# Patient Record
Sex: Female | Born: 1955 | Race: Black or African American | Hispanic: No | Smoking: Current some day smoker
Health system: Southern US, Community
[De-identification: ages and names within clinical notes are randomized; demographics above are authoritative.]

## PROBLEM LIST (undated history)

## (undated) DIAGNOSIS — I10 Essential (primary) hypertension: Secondary | ICD-10-CM

## (undated) DIAGNOSIS — G4733 Obstructive sleep apnea (adult) (pediatric): Secondary | ICD-10-CM

## (undated) DIAGNOSIS — Z9989 Dependence on other enabling machines and devices: Secondary | ICD-10-CM

## (undated) DIAGNOSIS — M51369 Other intervertebral disc degeneration, lumbar region without mention of lumbar back pain or lower extremity pain: Secondary | ICD-10-CM

## (undated) DIAGNOSIS — Z8742 Personal history of other diseases of the female genital tract: Secondary | ICD-10-CM

## (undated) DIAGNOSIS — R29898 Other symptoms and signs involving the musculoskeletal system: Secondary | ICD-10-CM

## (undated) DIAGNOSIS — M1990 Unspecified osteoarthritis, unspecified site: Secondary | ICD-10-CM

## (undated) DIAGNOSIS — E782 Mixed hyperlipidemia: Secondary | ICD-10-CM

## (undated) DIAGNOSIS — J45909 Unspecified asthma, uncomplicated: Secondary | ICD-10-CM

## (undated) DIAGNOSIS — Z973 Presence of spectacles and contact lenses: Secondary | ICD-10-CM

## (undated) DIAGNOSIS — M8580 Other specified disorders of bone density and structure, unspecified site: Secondary | ICD-10-CM

## (undated) DIAGNOSIS — H04123 Dry eye syndrome of bilateral lacrimal glands: Secondary | ICD-10-CM

## (undated) DIAGNOSIS — M5136 Other intervertebral disc degeneration, lumbar region: Secondary | ICD-10-CM

## (undated) DIAGNOSIS — M25551 Pain in right hip: Secondary | ICD-10-CM

## (undated) DIAGNOSIS — E119 Type 2 diabetes mellitus without complications: Secondary | ICD-10-CM

## (undated) DIAGNOSIS — M169 Osteoarthritis of hip, unspecified: Secondary | ICD-10-CM

## (undated) DIAGNOSIS — M25552 Pain in left hip: Secondary | ICD-10-CM

## (undated) HISTORY — PX: OTHER SURGICAL HISTORY: SHX169

## (undated) HISTORY — DX: Essential (primary) hypertension: I10

## (undated) HISTORY — PX: KNEE ARTHROSCOPY: SUR90

## (undated) HISTORY — PX: CARPAL TUNNEL RELEASE: SHX101

## (undated) HISTORY — PX: MULTIPLE TOOTH EXTRACTIONS: SHX2053

## (undated) HISTORY — DX: Dry eye syndrome of bilateral lacrimal glands: H04.123

## (undated) HISTORY — PX: COLONOSCOPY: SHX174

## (undated) HISTORY — PX: KNEE ARTHROSCOPY W/ LATERAL RELEASE: SHX1873

---

## 1979-01-02 HISTORY — PX: UMBILICAL HERNIA REPAIR: SHX196

## 1981-01-01 HISTORY — PX: APPENDECTOMY: SHX54

## 1997-08-03 ENCOUNTER — Ambulatory Visit (HOSPITAL_COMMUNITY): Admission: RE | Admit: 1997-08-03 | Discharge: 1997-08-03 | Payer: Self-pay | Admitting: Obstetrics and Gynecology

## 1998-10-20 ENCOUNTER — Ambulatory Visit (HOSPITAL_BASED_OUTPATIENT_CLINIC_OR_DEPARTMENT_OTHER): Admission: RE | Admit: 1998-10-20 | Discharge: 1998-10-20 | Payer: Self-pay | Admitting: Orthopedic Surgery

## 1999-01-26 ENCOUNTER — Ambulatory Visit (HOSPITAL_BASED_OUTPATIENT_CLINIC_OR_DEPARTMENT_OTHER): Admission: RE | Admit: 1999-01-26 | Discharge: 1999-01-26 | Payer: Self-pay | Admitting: Orthopedic Surgery

## 1999-09-21 ENCOUNTER — Encounter: Payer: Self-pay | Admitting: Family Medicine

## 1999-09-21 ENCOUNTER — Ambulatory Visit (HOSPITAL_COMMUNITY): Admission: RE | Admit: 1999-09-21 | Discharge: 1999-09-21 | Payer: Self-pay | Admitting: Family Medicine

## 2000-10-16 ENCOUNTER — Ambulatory Visit (HOSPITAL_COMMUNITY): Admission: RE | Admit: 2000-10-16 | Discharge: 2000-10-16 | Payer: Self-pay | Admitting: Family Medicine

## 2002-11-13 ENCOUNTER — Emergency Department (HOSPITAL_COMMUNITY): Admission: EM | Admit: 2002-11-13 | Discharge: 2002-11-13 | Payer: Self-pay | Admitting: Emergency Medicine

## 2002-11-20 ENCOUNTER — Emergency Department (HOSPITAL_COMMUNITY): Admission: EM | Admit: 2002-11-20 | Discharge: 2002-11-20 | Payer: Self-pay | Admitting: Emergency Medicine

## 2003-06-22 ENCOUNTER — Ambulatory Visit (HOSPITAL_COMMUNITY): Admission: RE | Admit: 2003-06-22 | Discharge: 2003-06-22 | Payer: Self-pay | Admitting: Family Medicine

## 2003-11-01 ENCOUNTER — Ambulatory Visit: Payer: Self-pay | Admitting: Obstetrics and Gynecology

## 2003-11-01 ENCOUNTER — Inpatient Hospital Stay (HOSPITAL_COMMUNITY): Admission: AD | Admit: 2003-11-01 | Discharge: 2003-11-05 | Payer: Self-pay | Admitting: Obstetrics and Gynecology

## 2003-11-15 ENCOUNTER — Ambulatory Visit (HOSPITAL_BASED_OUTPATIENT_CLINIC_OR_DEPARTMENT_OTHER): Admission: RE | Admit: 2003-11-15 | Discharge: 2003-11-15 | Payer: Self-pay | Admitting: *Deleted

## 2004-07-06 ENCOUNTER — Ambulatory Visit (HOSPITAL_COMMUNITY): Admission: RE | Admit: 2004-07-06 | Discharge: 2004-07-06 | Payer: Self-pay | Admitting: Family Medicine

## 2004-08-18 ENCOUNTER — Other Ambulatory Visit: Admission: RE | Admit: 2004-08-18 | Discharge: 2004-08-18 | Payer: Self-pay | Admitting: Internal Medicine

## 2004-12-20 ENCOUNTER — Ambulatory Visit (HOSPITAL_COMMUNITY): Admission: RE | Admit: 2004-12-20 | Discharge: 2004-12-20 | Payer: Self-pay | Admitting: Gastroenterology

## 2004-12-20 ENCOUNTER — Encounter (INDEPENDENT_AMBULATORY_CARE_PROVIDER_SITE_OTHER): Payer: Self-pay | Admitting: *Deleted

## 2005-01-01 DIAGNOSIS — Z8742 Personal history of other diseases of the female genital tract: Secondary | ICD-10-CM

## 2005-01-01 HISTORY — DX: Personal history of other diseases of the female genital tract: Z87.42

## 2005-07-27 ENCOUNTER — Ambulatory Visit (HOSPITAL_COMMUNITY): Admission: RE | Admit: 2005-07-27 | Discharge: 2005-07-27 | Payer: Self-pay | Admitting: Family Medicine

## 2005-10-15 ENCOUNTER — Inpatient Hospital Stay (HOSPITAL_COMMUNITY): Admission: EM | Admit: 2005-10-15 | Discharge: 2005-10-21 | Payer: Self-pay | Admitting: Obstetrics and Gynecology

## 2005-10-15 ENCOUNTER — Encounter: Payer: Self-pay | Admitting: Emergency Medicine

## 2006-03-12 ENCOUNTER — Emergency Department (HOSPITAL_COMMUNITY): Admission: EM | Admit: 2006-03-12 | Discharge: 2006-03-12 | Payer: Self-pay | Admitting: Emergency Medicine

## 2006-06-12 ENCOUNTER — Ambulatory Visit (HOSPITAL_BASED_OUTPATIENT_CLINIC_OR_DEPARTMENT_OTHER): Admission: RE | Admit: 2006-06-12 | Discharge: 2006-06-12 | Payer: Self-pay | Admitting: Orthopedic Surgery

## 2006-07-10 ENCOUNTER — Encounter: Admission: RE | Admit: 2006-07-10 | Discharge: 2006-08-22 | Payer: Self-pay | Admitting: Orthopedic Surgery

## 2006-08-01 ENCOUNTER — Ambulatory Visit (HOSPITAL_COMMUNITY): Admission: RE | Admit: 2006-08-01 | Discharge: 2006-08-01 | Payer: Self-pay | Admitting: Family Medicine

## 2006-12-20 ENCOUNTER — Other Ambulatory Visit: Admission: RE | Admit: 2006-12-20 | Discharge: 2006-12-20 | Payer: Self-pay | Admitting: Family Medicine

## 2007-08-05 ENCOUNTER — Ambulatory Visit (HOSPITAL_COMMUNITY): Admission: RE | Admit: 2007-08-05 | Discharge: 2007-08-05 | Payer: Self-pay | Admitting: Family Medicine

## 2007-08-14 ENCOUNTER — Ambulatory Visit (HOSPITAL_BASED_OUTPATIENT_CLINIC_OR_DEPARTMENT_OTHER): Admission: RE | Admit: 2007-08-14 | Discharge: 2007-08-14 | Payer: Self-pay | Admitting: Orthopedic Surgery

## 2008-02-25 ENCOUNTER — Other Ambulatory Visit: Admission: RE | Admit: 2008-02-25 | Discharge: 2008-02-25 | Payer: Self-pay | Admitting: Family Medicine

## 2008-03-02 ENCOUNTER — Ambulatory Visit (HOSPITAL_COMMUNITY): Admission: RE | Admit: 2008-03-02 | Discharge: 2008-03-02 | Payer: Self-pay | Admitting: Family Medicine

## 2008-08-06 ENCOUNTER — Ambulatory Visit (HOSPITAL_COMMUNITY): Admission: RE | Admit: 2008-08-06 | Discharge: 2008-08-06 | Payer: Self-pay | Admitting: Family Medicine

## 2008-10-25 ENCOUNTER — Ambulatory Visit: Payer: Self-pay | Admitting: Obstetrics and Gynecology

## 2008-10-25 ENCOUNTER — Inpatient Hospital Stay (HOSPITAL_COMMUNITY): Admission: AD | Admit: 2008-10-25 | Discharge: 2008-10-29 | Payer: Self-pay | Admitting: Obstetrics & Gynecology

## 2008-11-11 ENCOUNTER — Ambulatory Visit: Payer: Self-pay | Admitting: Obstetrics and Gynecology

## 2008-12-01 ENCOUNTER — Ambulatory Visit: Payer: Self-pay | Admitting: Obstetrics & Gynecology

## 2008-12-01 LAB — CONVERTED CEMR LAB
Clue Cells Wet Prep HPF POC: NONE SEEN
Trich, Wet Prep: NONE SEEN
Yeast Wet Prep HPF POC: NONE SEEN

## 2008-12-10 ENCOUNTER — Other Ambulatory Visit: Admission: RE | Admit: 2008-12-10 | Discharge: 2008-12-10 | Payer: Self-pay | Admitting: Obstetrics and Gynecology

## 2008-12-10 ENCOUNTER — Ambulatory Visit: Payer: Self-pay | Admitting: Obstetrics & Gynecology

## 2009-04-22 ENCOUNTER — Ambulatory Visit (HOSPITAL_COMMUNITY): Admission: RE | Admit: 2009-04-22 | Discharge: 2009-04-22 | Payer: Self-pay | Admitting: Obstetrics and Gynecology

## 2009-08-30 ENCOUNTER — Ambulatory Visit (HOSPITAL_COMMUNITY): Admission: RE | Admit: 2009-08-30 | Discharge: 2009-08-30 | Payer: Self-pay | Admitting: Family Medicine

## 2009-12-07 ENCOUNTER — Encounter
Admission: RE | Admit: 2009-12-07 | Discharge: 2009-12-29 | Payer: Self-pay | Source: Home / Self Care | Attending: Physical Medicine and Rehabilitation | Admitting: Physical Medicine and Rehabilitation

## 2010-03-18 ENCOUNTER — Emergency Department (HOSPITAL_COMMUNITY): Payer: Medicaid Other

## 2010-03-18 ENCOUNTER — Observation Stay (HOSPITAL_COMMUNITY)
Admission: EM | Admit: 2010-03-18 | Discharge: 2010-03-19 | Disposition: A | Discharge status: Home / Self Care | Payer: Medicaid Other | Attending: Emergency Medicine | Admitting: Emergency Medicine

## 2010-03-18 DIAGNOSIS — M5412 Radiculopathy, cervical region: Principal | ICD-10-CM | POA: Insufficient documentation

## 2010-03-18 DIAGNOSIS — H539 Unspecified visual disturbance: Secondary | ICD-10-CM | POA: Insufficient documentation

## 2010-03-18 DIAGNOSIS — M25559 Pain in unspecified hip: Secondary | ICD-10-CM | POA: Insufficient documentation

## 2010-03-18 DIAGNOSIS — G459 Transient cerebral ischemic attack, unspecified: Secondary | ICD-10-CM | POA: Insufficient documentation

## 2010-03-18 LAB — CK TOTAL AND CKMB (NOT AT ARMC)
Relative Index: 1.8 (ref 0.0–2.5)
Total CK: 188 U/L — ABNORMAL HIGH (ref 7–177)

## 2010-03-18 LAB — COMPREHENSIVE METABOLIC PANEL
ALT: 20 U/L (ref 0–35)
AST: 27 U/L (ref 0–37)
Albumin: 4 g/dL (ref 3.5–5.2)
Alkaline Phosphatase: 55 U/L (ref 39–117)
BUN: 13 mg/dL (ref 6–23)
CO2: 27 mEq/L (ref 19–32)
Calcium: 9.8 mg/dL (ref 8.4–10.5)
Chloride: 104 mEq/L (ref 96–112)
Creatinine, Ser: 0.69 mg/dL (ref 0.4–1.2)
GFR calc Af Amer: >60 mL/min (ref >60)
GFR calc non Af Amer: >60 mL/min (ref >60)
Glucose, Bld: 91 mg/dL (ref 70–99)
Potassium: 4.2 mEq/L (ref 3.5–5.1)
Sodium: 139 mEq/L (ref 135–145)
Total Bilirubin: 1.1 mg/dL (ref 0.3–1.2)
Total Protein: 7.9 g/dL (ref 6.0–8.3)

## 2010-03-18 LAB — DIFFERENTIAL
Basophils Absolute: 0.1 10*3/uL (ref 0.0–0.1)
Basophils Relative: 0 % (ref 0–1)
Eosinophils Absolute: 0.4 10*3/uL (ref 0.0–0.7)
Eosinophils Relative: 3 % (ref 0–5)
Lymphocytes Relative: 37 % (ref 12–46)
Lymphs Abs: 4.3 10*3/uL — ABNORMAL HIGH (ref 0.7–4.0)
Monocytes Absolute: 0.9 10*3/uL (ref 0.1–1.0)
Monocytes Relative: 8 % (ref 3–12)
Neutro Abs: 6 10*3/uL (ref 1.7–7.7)
Neutrophils Relative %: 52 % (ref 43–77)

## 2010-03-18 LAB — URINALYSIS, ROUTINE W REFLEX MICROSCOPIC
Bilirubin Urine: NEGATIVE
Glucose, UA: NEGATIVE mg/dL
Hgb urine dipstick: NEGATIVE
Ketones, ur: NEGATIVE mg/dL
Nitrite: NEGATIVE
Protein, ur: NEGATIVE mg/dL
Specific Gravity, Urine: 1.023 (ref 1.005–1.030)
Urobilinogen, UA: 0.2 mg/dL (ref 0.0–1.0)
pH: 5 (ref 5.0–8.0)

## 2010-03-18 LAB — CBC
HCT: 41.9 % (ref 36.0–46.0)
Hemoglobin: 14.5 g/dL (ref 12.0–15.0)
MCH: 31.2 pg (ref 26.0–34.0)
MCHC: 34.6 g/dL (ref 30.0–36.0)
MCV: 90.1 fL (ref 78.0–100.0)
Platelets: 296 10*3/uL (ref 150–400)
RBC: 4.65 MIL/uL (ref 3.87–5.11)
RDW: 15.2 % (ref 11.5–15.5)
WBC: 11.5 10*3/uL — ABNORMAL HIGH (ref 4.0–10.5)

## 2010-03-18 LAB — GLUCOSE, CAPILLARY: Glucose-Capillary: 89 mg/dL (ref 70–99)

## 2010-03-18 LAB — POCT CARDIAC MARKERS
CKMB, poc: 2 ng/mL (ref 1.0–8.0)
Myoglobin, poc: 65.4 ng/mL (ref 12–200)
Troponin i, poc: <0.05 ng/mL (ref 0.00–0.09)

## 2010-03-18 LAB — TROPONIN I: Troponin I: <0.01 ng/mL (ref 0.00–0.06)

## 2010-03-18 LAB — APTT: aPTT: 29 seconds (ref 24–37)

## 2010-03-19 ENCOUNTER — Other Ambulatory Visit: Payer: Self-pay | Admitting: Emergency Medicine

## 2010-03-19 ENCOUNTER — Observation Stay (HOSPITAL_COMMUNITY): Payer: Medicaid Other

## 2010-03-19 DIAGNOSIS — G459 Transient cerebral ischemic attack, unspecified: Secondary | ICD-10-CM

## 2010-03-19 LAB — LIPID PANEL
HDL: 36 mg/dL — ABNORMAL LOW (ref >39)
Total CHOL/HDL Ratio: 6.6 RATIO
Triglycerides: 180 mg/dL — ABNORMAL HIGH (ref <150)
VLDL: 36 mg/dL (ref 0–40)

## 2010-03-19 LAB — HEMOGLOBIN A1C
Hgb A1c MFr Bld: 7.7 % — ABNORMAL HIGH (ref <5.7)
Mean Plasma Glucose: 174 mg/dL — ABNORMAL HIGH (ref <117)

## 2010-03-19 LAB — CK TOTAL AND CKMB (NOT AT ARMC): Total CK: 160 U/L (ref 7–177)

## 2010-03-19 LAB — TROPONIN I: Troponin I: 0.02 ng/mL (ref 0.00–0.06)

## 2010-03-21 LAB — COMPREHENSIVE METABOLIC PANEL
ALT: 21 U/L (ref 0–35)
Albumin: 3.9 g/dL (ref 3.5–5.2)
Alkaline Phosphatase: 56 U/L (ref 39–117)
Calcium: 9.9 mg/dL (ref 8.4–10.5)
GFR calc Af Amer: >60 mL/min (ref >60)
Glucose, Bld: 159 mg/dL — ABNORMAL HIGH (ref 70–99)
Potassium: 3.6 mEq/L (ref 3.5–5.1)
Sodium: 141 mEq/L (ref 135–145)
Total Protein: 7.3 g/dL (ref 6.0–8.3)

## 2010-03-21 LAB — URINALYSIS, ROUTINE W REFLEX MICROSCOPIC
Glucose, UA: NEGATIVE mg/dL
Ketones, ur: NEGATIVE mg/dL
Protein, ur: NEGATIVE mg/dL
pH: 5 (ref 5.0–8.0)

## 2010-03-21 LAB — CBC
Hemoglobin: 13.9 g/dL (ref 12.0–15.0)
MCHC: 33.8 g/dL (ref 30.0–36.0)
Platelets: 267 10*3/uL (ref 150–400)
RDW: 16 % — ABNORMAL HIGH (ref 11.5–15.5)

## 2010-03-21 LAB — URINE MICROSCOPIC-ADD ON

## 2010-03-24 ENCOUNTER — Other Ambulatory Visit: Payer: Self-pay | Admitting: Ophthalmology

## 2010-03-24 ENCOUNTER — Ambulatory Visit (HOSPITAL_COMMUNITY)
Admission: RE | Admit: 2010-03-24 | Discharge: 2010-03-24 | Disposition: A | Discharge status: Home / Self Care | Payer: Medicaid Other | Source: Ambulatory Visit | Attending: Ophthalmology | Admitting: Ophthalmology

## 2010-03-24 DIAGNOSIS — I658 Occlusion and stenosis of other precerebral arteries: Secondary | ICD-10-CM | POA: Insufficient documentation

## 2010-03-24 DIAGNOSIS — H3400 Transient retinal artery occlusion, unspecified eye: Secondary | ICD-10-CM

## 2010-03-24 DIAGNOSIS — I6529 Occlusion and stenosis of unspecified carotid artery: Secondary | ICD-10-CM | POA: Insufficient documentation

## 2010-04-06 LAB — BASIC METABOLIC PANEL
CO2: 28 mEq/L (ref 19–32)
Calcium: 9 mg/dL (ref 8.4–10.5)
Chloride: 98 mEq/L (ref 96–112)
GFR calc Af Amer: >60 mL/min (ref >60)
Sodium: 134 mEq/L — ABNORMAL LOW (ref 135–145)

## 2010-04-06 LAB — CULTURE, BLOOD (ROUTINE X 2)
Culture: NO GROWTH
Culture: NO GROWTH

## 2010-04-06 LAB — URINALYSIS, ROUTINE W REFLEX MICROSCOPIC
Ketones, ur: 15 mg/dL — AB
Leukocytes, UA: NEGATIVE
Nitrite: NEGATIVE
Protein, ur: 30 mg/dL — AB
pH: 5.5 (ref 5.0–8.0)

## 2010-04-06 LAB — WET PREP, GENITAL
Clue Cells Wet Prep HPF POC: NONE SEEN
Yeast Wet Prep HPF POC: NONE SEEN

## 2010-04-06 LAB — DIFFERENTIAL
Basophils Relative: 0 % (ref 0–1)
Eosinophils Absolute: 0.1 10*3/uL (ref 0.0–0.7)
Eosinophils Relative: 0 % (ref 0–5)
Monocytes Absolute: 0.4 10*3/uL (ref 0.1–1.0)
Monocytes Relative: 2 % — ABNORMAL LOW (ref 3–12)
Neutro Abs: 24.6 10*3/uL — ABNORMAL HIGH (ref 1.7–7.7)

## 2010-04-06 LAB — COMPREHENSIVE METABOLIC PANEL
ALT: 20 U/L (ref 0–35)
AST: 19 U/L (ref 0–37)
Albumin: 3.2 g/dL — ABNORMAL LOW (ref 3.5–5.2)
Alkaline Phosphatase: 110 U/L (ref 39–117)
GFR calc Af Amer: 34 mL/min — ABNORMAL LOW (ref >60)
Potassium: 3.8 mEq/L (ref 3.5–5.1)
Sodium: 135 mEq/L (ref 135–145)
Total Protein: 7.8 g/dL (ref 6.0–8.3)

## 2010-04-06 LAB — CBC
MCHC: 33.8 g/dL (ref 30.0–36.0)
Platelets: 414 10*3/uL — ABNORMAL HIGH (ref 150–400)
Platelets: 443 10*3/uL — ABNORMAL HIGH (ref 150–400)
RBC: 3.56 MIL/uL — ABNORMAL LOW (ref 3.87–5.11)
RBC: 3.68 MIL/uL — ABNORMAL LOW (ref 3.87–5.11)
RDW: 14.5 % (ref 11.5–15.5)
RDW: 14.7 % (ref 11.5–15.5)
WBC: 10.5 10*3/uL (ref 4.0–10.5)
WBC: 25.7 10*3/uL — ABNORMAL HIGH (ref 4.0–10.5)

## 2010-04-06 LAB — GC/CHLAMYDIA PROBE AMP, GENITAL: Chlamydia, DNA Probe: NEGATIVE

## 2010-04-06 LAB — GENTAMICIN LEVEL, PEAK: Gentamicin Pk: 6.9 ug/mL (ref 5.0–10.0)

## 2010-04-06 LAB — URINE MICROSCOPIC-ADD ON

## 2010-05-16 NOTE — Op Note (Signed)
[name], Tamara Stokes           ACCOUNT NO.:  [id number]   MEDICAL RECORD NO.:  [id number]          PATIENT TYPE:  AMB   LOCATION:  DSC                          FACILITY:  [facility]   PHYSICIAN:  Loreta Ave, M.D. DATE OF BIRTH:  09-04-1955   DATE OF PROCEDURE:  08/14/2007  DATE OF DISCHARGE:                               OPERATIVE REPORT   PREOPERATIVE DIAGNOSES:  1. Triggering A1 pulley, left thumb.  2. Triggering A1 pulley, right ring finger.   POSTOPERATIVE DIAGNOSES:  1. Triggering A1 pulley, left thumb.  2. Triggering A1 pulley, right ring finger.   PROCEDURES:  1. Release A1 pulley, left thumb.  2. Injection A1 pulley, right ring finger.   SURGEON:  Loreta Ave, MD   ASSISTANT:  Zonia Kief, PA   ANESTHESIA:  General.   ESTIMATED BLOOD LOSS:  Minimal.   SPECIMENS:  None.   CULTURES:  None.   COMPLICATIONS:  None.   DRESSING:  Soft compressive on the left.   TOURNIQUET TIME:  30 minutes on the left.   PROCEDURE:  The patient was brought to the operating room, placed on the  operating table in the supine position.  After adequate anesthesia had  been obtained, attention was first turned to the right.  Under sterile  technique, the A1 pulley was injected with Depo-Medrol and Marcaine.  This was 0.25% Marcaine without epinephrine and a total of 40 mg of Depo-  Medrol.  I easily injected avoiding neurovascular structures and not  injecting the tendon.  Band-Aid applied.  Attention turned to the left.  Tourniquet applied.  Prepped and draped in usual sterile fashion.  Exsanguinated with elevation and Esmarch, tourniquet inflated to 250  mmHg.  Small transverse incision over the A1 pulley of the left thumb.  Neurovascular bundles identified, protected, and retracted.  A1 pulley  identified, incised in  its entirety obliterating all triggering.  Small portion was excised.  Some attrition on the tendon but relatively superficial.  Confirmed  obliteration  of all triggering.  Wound irrigated.  Skin closed with  nylon.  Sterile compressive dressing applied.  Anesthesia reversed.  Brought to recovery room.  Tolerated the surgery well.  No  complications.      Loreta Ave, M.D.  Electronically Signed     DFM/MEDQ  D:  08/14/2007  T:  08/14/2007  Job:  [id number]

## 2010-05-19 NOTE — Op Note (Signed)
[place]  Patient:    Tamara Stokes                   MRN: [id number] Proc. Date: 01/26/99 Adm. Date:  [id number] Attending:  Colbert Ewing                           Operative Report  PREOPERATIVE DIAGNOSIS:  Carpal tunnel syndrome, left.  POSTOPERATIVE DIAGNOSIS:  Carpal tunnel syndrome, left.  OPERATION PERFORMED:  Left carpal tunnel releas.  SURGEON:  Loreta Ave, M.D.  ASSISTANT:  Arlys John D. Petrarca, P.A.-C.  ANESTHESIA:  IV regional.  SPECIMENS:  None.  CULTURES:  None.  COMPLICATIONS:  None.  DRESSING:  Soft compressive with bulky hand dressing and splint.  DESCRIPTION OF PROCEDURE:  The patient was brought to the operating room and after adequate anesthesia had been obtained, prepped and draped in the usual sterile fashion.  A curved incision along the thenar eminence ending at the distal wrist crease, care taken to avoid the palmar branch of the median nerve.  The retinaculum over the carpal tunnel exposed and incised in its entirely under direct visualization from the palmar arch distally to the forearm fascia proximally. Moderate to marked hourglass constriction of the nerve with hyperemia.  The nerve completely decompressed throughout with epineurolysis as well as tenosynovectomy. Motor branches, digital branches identified, completely decompressed maintaining continuity.  Other structures of the carpal tunnel normal.  The wound irrigated and closed with nylon on the skin.  Margins of the wound injected with Marcaine. Sterile compressive dressing with bulky hand dressing and splint applied. Anesthesia reversed.  Brought to recovery room.  Tolerated surgery well.  No complications. DD:  01/26/99 TD:  01/27/99 Job: [id number]

## 2010-05-19 NOTE — Op Note (Signed)
NAMEKEARSTEN, GINTHER           ACCOUNT NO.:  [id number]   MEDICAL RECORD NO.:  [id number]            PATIENT TYPE:   LOCATION:                                 FACILITY:   PHYSICIAN:  James L. Malon Kindle., M.D.DATE OF BIRTH:  06-22-1955   DATE OF PROCEDURE:  12/20/2004  DATE OF DISCHARGE:                                 OPERATIVE REPORT   PROCEDURE:  Colonoscopy and polypectomy.   ENDOSCOPIST:  Llana Aliment. Edwards, M.D.   MEDICATIONS:  Fentanyl 125 mcg, Versed 10 mg IV.   INDICATIONS:  Colon cancer screening.   DESCRIPTION OF PROCEDURE:  The procedure explained to the patient and  consent obtained.  With the patient in the left lateral decubitus position,  a pediatric adjustable scope was inserted and advanced.  The prep was  marginal; there was still some sticky retained stool.  We were able to reach  the cecum using abdominal pressure.  The ileocecal valve and appendiceal  orifice was seen.   The scope was withdrawn into cecum, ascending colon, transverse colon,  descending and sigmoid colon were seen well.  No polyps were seen until 30  cm from the anal verge where a 0.5 cm polyp was snared and sucked through  the scope. There was no diverticular disease, no other polyps seen in the  rectum.  The scope was withdrawn.  The patient tolerated the procedure well.   PLAN:  1.  Will recommend yearly Hemoccults.  2.  Check pain.  3.  Will probably recommend repeating procedure in 5 years.           ______________________________  Llana Aliment Malon Kindle., M.D.     Waldron Session  D:  12/20/2004  T:  12/21/2004  Job:  [id number]

## 2010-05-19 NOTE — Discharge Summary (Signed)
Tamara Stokes, Tamara Stokes           ACCOUNT NO.:  [id number]   MEDICAL RECORD NO.:  [id number]          PATIENT TYPE:  INP   LOCATION:  9306                          FACILITY:  [facility]   PHYSICIAN:  Lenoard Aden, M.D.DATE OF BIRTH:  [birth year]   DATE OF ADMISSION:  10/15/2005  DATE OF DISCHARGE:  10/21/2005                               DISCHARGE SUMMARY   The patient was admitted for acute exacerbation of chronic PID with pain  in her lower abdomen and shaking describes chills.  She is a G2, P0 with  no living children.  Had a previous stillbirth.   HOSPITAL COURSE:  She was admitted for elevated blood pressure and  elevated white count.  Ultrasound revealed a probable tubo-ovarian  abscess.  Medications were antibiotics for which she tolerated well.  Discharged to home on the twenty-first with p.o. antibiotics.  Discharge  teaching done and follow-up in the OB/GYN clinic.      Lenoard Aden, M.D.  Electronically Signed     RJT/MEDQ  D:  11/29/2005  T:  11/30/2005  Job:  [id number]

## 2010-05-19 NOTE — Discharge Summary (Signed)
Tamara Stokes, Tamara Stokes           ACCOUNT NO.:  [id number]   MEDICAL RECORD NO.:  [id number]          PATIENT TYPE:  INP   LOCATION:  9303                          FACILITY:  [facility]   PHYSICIAN:  Phil D. Okey Dupre, M.D.     DATE OF BIRTH:  1955/11/12   DATE OF ADMISSION:  11/01/2003  DATE OF DISCHARGE:                                 DISCHARGE SUMMARY   HISTORY OF PRESENT ILLNESS:  This is a 55 year old African-American female  who presented to the MAU on November 01, 2003 with complaints of lower  abdominal pain predominantly on the left side of her lower abdomen radiating  to the middle abdomen, and also shaking that she described as chills.  She  also related having diarrhea through the course of the day.  The patient is  a gravida 2 term 1 preterm 0 SAB 1 with no living children.  Her one term  delivery was a stillborn.  She states that for a couple of days she has had  some pain which has worsened and at the time of admission had begun to have  chills.   HOSPITAL COURSE:  On admission, the patient's temperature was 102.9, white  count was 18.  Ultrasound revealed a 10 cm x 5 cm cystic mass on the left  adnexa consistent with a left tuboovarian abscess.  Medications on admission  were Benicar HCT for hypertension.  She was begun on gentamycin,  clindamycin, and ampicillin, did receive those for 3 days.  After 3 days she  was afebrile and changed to doxycycline 100 mg one p.o. b.i.d. and Flagyl  500 one p.o. b.i.d.  On hospital day #4, she is eating and drinking without  difficulty.  She is afebrile and has been afebrile greater than 24 hours.  Her blood pressures are within normal range.  She did relate some clinical  history of sleep apnea and also had an episode in the hospital where O2  saturations dropped, and is concerned about this.   ADMISSION DIAGNOSES:  1.  Abdominal pain, probable pelvic inflammatory disease.  2.  Chronic hypertension.  3.  Morbid obesity.   DISCHARGE  DIAGNOSES:  1.  Left tuboovarian abscess.  2.  Newly-diagnosed diabetes mellitus type 2.  3.  Chronic hypertension.  4.  Morbid obesity.   DISCHARGE INSTRUCTIONS:  The patient is to follow up in the GYN clinic  within 2 weeks.  She is to follow up with her regular physician, Dr.  Francesca Oman, and have a follow-up  with the new diagnosis of diabetes  within 2-3 weeks.  She is also to keep her appointment for her sleep study.  She is to return to the MAU or GYN clinic for temperature greater than 100.  She is to follow an 1800 calorie ADA diet.   DISCHARGE MEDICATIONS:  1.  Doxycycline 100 mg one p.o. b.i.d. for 14 days.  2.  Flagyl 500 mg one p.o. b.i.d. for 14 days.  3.  She can use Advil, Aleve, or Tylenol for pain.  4.  She is to continue her blood pressure medications as ordered by Dr.  Dayton Scrape.      [place]/MEDQ  D:  11/05/2003  T:  11/05/2003  Job:  [id number]

## 2010-05-19 NOTE — Procedures (Signed)
NAME:  Tamara Stokes, Tamara Stokes           ACCOUNT NO.:  [id number]   MEDICAL RECORD NO.:  [id number]          PATIENT TYPE:  OUT   LOCATION:  SLEEP CENTER                 FACILITY:  [facility]   PHYSICIAN:  Clinton D. Maple Hudson, M.D. DATE OF BIRTH:  [birth year]   DATE OF STUDY:  11/15/2003                              NOCTURNAL POLYSOMNOGRAM   REFERRING PHYSICIAN:  Corky Sox, MD   INDICATION FOR STUDY:  Hypersomnia with sleep apnea.  Epworth Sleepiness  Score 22/24, neck size 17 inches, BMI 43, weight 245 pounds.   SLEEP ARCHITECTURE:  Total sleep time 436 minutes with sleep efficiency 92%.  Stage 1 was 5%, stage 2 58%, stages 3 and 4 14%.  REM was 24% of total sleep  time.  Sleep latency 7.5 minutes, REM latency 136 minutes, awake after sleep  onset 32 minutes, arousal index increased at 34.   RESPIRATORY DATA:  Split study protocol.  RDI 106.5 per hour indicating very  severe obstructive sleep apnea/hypopnea syndrome before CPAP.  This included  223 obstructive apneas, 107 hypopneas before CPAP.  Events were not  positional, but relatively more common while sleeping supine.  REM RDI 23  per hour.  CPAP was titrated to 11 CWP, RDI 1.4 per hour using a medium  Fisher[employer]Paykel HC 405 mask with heated humidifier.   OXYGEN DATA:  Moderate snoring with oxygen desaturation to a nadir of 64%  during apneas.  With CPAP control, saturation held 94 to 96% on room air.   CARDIAC DATA:  Normal sinus rhythm.   MOVEMENT/PARASOMNIA:  Occasional leg jerks with insignificant effect on  sleep.  Bathroom x1.   IMPRESSION/RECOMMENDATION:  Very severe obstructive sleep apnea/hypopnea  syndrome, RDI 106.5 per hour with a desaturation to 64%.  CPAP control of 11  CWP, RDI 1.4 per hour using a medium Fisher-Paykel HC 405 mask with heated  humidifier.                                                           Clinton D. Maple Hudson, M.D.  Diplomate, [employer]   CDY/MEDQ  D:  11/21/2003 11:39:16  T:  11/21/2003  16:02:09  Job:  [id number]

## 2010-05-19 NOTE — Op Note (Signed)
NAMEMANILA, Tamara Stokes           ACCOUNT NO.:  [id number]   MEDICAL RECORD NO.:  [id number]          PATIENT TYPE:  AMB   LOCATION:  DSC                          FACILITY:  [facility]   PHYSICIAN:  Loreta Ave, M.D. DATE OF BIRTH:  Dec 31, 1955   DATE OF PROCEDURE:  06/13/2006  DATE OF DISCHARGE:                               OPERATIVE REPORT   PREOPERATIVE DIAGNOSIS:  Right knee posttraumatic chondromalacia  patella, with lateral tracking and tethering.   POSTOPERATIVE DIAGNOSIS:  Right knee posttraumatic chondromalacia  patella, with lateral tracking and tethering, with grade II and III  chondral changes trochlea and patella lateral aspect.  Also frayed flap  tears posterior horn of the medial meniscus.   PROCEDURE:  Right knee exam under anesthesia, arthroscopy, partial  medial meniscectomy.  Chondroplasty patellofemoral joint.  Lateral  retinacular release.   SURGEON:  Loreta Ave, M.D.   ASSISTANT:  Zonia Kief, PA.   ANESTHESIA:  General.   BLOOD LOSS:  Minimal.   TOURNIQUET:  Not employed.   SPECIMENS:  None.   COMPLICATIONS:  None.   DRESSINGS:  Soft, compressive, with lateral bolster   PROCEDURE:  The patient was brought to the operating room and placed on  the operating table in the supine position.  After adequate anesthesia  had been obtained, tourniquet and leg holder applied.  Leg prepped and  draped in the usual sterile fashion.  Knee examined.  Lateral tracking,  crepitus, and tethering of the patellofemoral joint.  Stable ligaments.  After being prepped and draped, three portals were created, one  superolateral, one each medial and lateral parapatellar.  Inflow  catheter introduced, knee distended, and arthroscope was introduced, and  the knee inspected.  Grade II and III posttraumatic changes trochlea and  patella lateral aspect.  Lateral tilting and tethering.  Chondroplasty  to a stable surface.  Tracking assessed throughout.  Sufficient  tethering to warrant release.  Remaining knee examined.  Articular  cartilage intact throughout.  Flap tears, frayed tears about half way  back through the post horn of the medial meniscus.  Saucerized out to a  stable rim, tapered into remaining meniscus.  Lateral meniscus intact.  Cruciate ligaments intact.  Once that was complete, lateral release  performed with cautery from the vastus lateralis superiorly all the way  down to the lateral joint line inferiorly.  Nice release, with marked  improvement of tracking at completion, confirmed looking from all  portals.  Hemostasis with cautery.  Instruments  and fluid removed.  Knee injected with Marcaine.  Portals closed with  nylon.  Sterile compressive dressing applied.  Lateral bolster applied.  Anesthesia reversed.  Brought to the recovery room.  Tolerated the  surgery well.  No complications.      Loreta Ave, M.D.  Electronically Signed     DFM/MEDQ  D:  06/13/2006  T:  06/13/2006  Job:  [id number]

## 2010-08-25 ENCOUNTER — Other Ambulatory Visit (HOSPITAL_COMMUNITY): Payer: Self-pay | Admitting: Family Medicine

## 2010-08-25 DIAGNOSIS — Z1231 Encounter for screening mammogram for malignant neoplasm of breast: Secondary | ICD-10-CM

## 2010-09-08 ENCOUNTER — Ambulatory Visit (HOSPITAL_COMMUNITY): Payer: Medicaid Other

## 2010-09-15 ENCOUNTER — Ambulatory Visit (HOSPITAL_COMMUNITY): Payer: Medicaid Other

## 2010-09-21 ENCOUNTER — Ambulatory Visit (HOSPITAL_COMMUNITY): Payer: Medicaid Other

## 2010-09-29 LAB — BASIC METABOLIC PANEL
CO2: 27
Calcium: 9.6
Creatinine, Ser: 0.63
GFR calc Af Amer: >60
Sodium: 140

## 2010-09-29 LAB — POCT HEMOGLOBIN-HEMACUE: Hemoglobin: 14.4

## 2010-10-09 ENCOUNTER — Ambulatory Visit: Payer: Medicaid Other | Attending: Sports Medicine | Admitting: Rehabilitative and Restorative Service Providers"

## 2010-10-09 DIAGNOSIS — M25673 Stiffness of unspecified ankle, not elsewhere classified: Secondary | ICD-10-CM | POA: Insufficient documentation

## 2010-10-09 DIAGNOSIS — IMO0001 Reserved for inherently not codable concepts without codable children: Secondary | ICD-10-CM | POA: Insufficient documentation

## 2010-10-09 DIAGNOSIS — M25676 Stiffness of unspecified foot, not elsewhere classified: Secondary | ICD-10-CM | POA: Insufficient documentation

## 2010-10-09 DIAGNOSIS — M25579 Pain in unspecified ankle and joints of unspecified foot: Secondary | ICD-10-CM | POA: Insufficient documentation

## 2010-10-19 ENCOUNTER — Ambulatory Visit: Payer: Medicaid Other | Admitting: Rehabilitation

## 2010-10-19 LAB — BASIC METABOLIC PANEL
CO2: 27
Calcium: 9.4
Creatinine, Ser: 0.67
GFR calc Af Amer: >60
GFR calc non Af Amer: >60
Sodium: 139

## 2010-10-19 LAB — POCT HEMOGLOBIN-HEMACUE
Hemoglobin: 13.7
Operator id: 123881

## 2010-10-26 ENCOUNTER — Encounter: Payer: Medicaid Other | Admitting: Rehabilitation

## 2010-11-06 ENCOUNTER — Encounter: Payer: Medicaid Other | Admitting: Rehabilitation

## 2010-11-27 ENCOUNTER — Ambulatory Visit: Payer: Medicaid Other | Attending: Sports Medicine

## 2010-11-27 DIAGNOSIS — M25676 Stiffness of unspecified foot, not elsewhere classified: Secondary | ICD-10-CM | POA: Insufficient documentation

## 2010-11-27 DIAGNOSIS — M25673 Stiffness of unspecified ankle, not elsewhere classified: Secondary | ICD-10-CM | POA: Insufficient documentation

## 2010-11-27 DIAGNOSIS — IMO0001 Reserved for inherently not codable concepts without codable children: Secondary | ICD-10-CM | POA: Insufficient documentation

## 2010-11-27 DIAGNOSIS — M25579 Pain in unspecified ankle and joints of unspecified foot: Secondary | ICD-10-CM | POA: Insufficient documentation

## 2010-11-30 ENCOUNTER — Ambulatory Visit (HOSPITAL_COMMUNITY)
Admission: RE | Admit: 2010-11-30 | Discharge: 2010-11-30 | Disposition: A | Discharge status: Home / Self Care | Payer: Medicaid Other | Source: Ambulatory Visit | Attending: Family Medicine | Admitting: Family Medicine

## 2010-11-30 DIAGNOSIS — Z1231 Encounter for screening mammogram for malignant neoplasm of breast: Secondary | ICD-10-CM | POA: Insufficient documentation

## 2010-12-21 ENCOUNTER — Ambulatory Visit (HOSPITAL_BASED_OUTPATIENT_CLINIC_OR_DEPARTMENT_OTHER): Admission: RE | Admit: 2010-12-21 | Payer: Medicaid Other | Source: Ambulatory Visit | Admitting: Orthopedic Surgery

## 2010-12-21 ENCOUNTER — Encounter (HOSPITAL_BASED_OUTPATIENT_CLINIC_OR_DEPARTMENT_OTHER): Admission: RE | Payer: Self-pay | Source: Ambulatory Visit

## 2010-12-21 SURGERY — EXCISION, GANGLION CYST, WRIST
Anesthesia: Choice | Laterality: Left

## 2011-02-20 ENCOUNTER — Other Ambulatory Visit: Payer: Self-pay | Admitting: Orthopedic Surgery

## 2011-02-20 ENCOUNTER — Ambulatory Visit
Admission: RE | Admit: 2011-02-20 | Discharge: 2011-02-20 | Disposition: A | Discharge status: Home / Self Care | Payer: Medicaid Other | Source: Ambulatory Visit | Attending: Orthopedic Surgery | Admitting: Orthopedic Surgery

## 2011-02-20 DIAGNOSIS — M79606 Pain in leg, unspecified: Secondary | ICD-10-CM

## 2011-02-22 ENCOUNTER — Ambulatory Visit
Admission: RE | Admit: 2011-02-22 | Discharge: 2011-02-22 | Disposition: A | Discharge status: Home / Self Care | Payer: Medicaid Other | Source: Ambulatory Visit | Attending: Orthopedic Surgery | Admitting: Orthopedic Surgery

## 2011-02-22 DIAGNOSIS — M79606 Pain in leg, unspecified: Secondary | ICD-10-CM

## 2011-03-26 ENCOUNTER — Ambulatory Visit: Payer: Medicaid Other | Attending: Orthopaedic Surgery | Admitting: Physical Therapy

## 2011-03-26 DIAGNOSIS — R293 Abnormal posture: Secondary | ICD-10-CM | POA: Insufficient documentation

## 2011-03-26 DIAGNOSIS — M25559 Pain in unspecified hip: Secondary | ICD-10-CM | POA: Insufficient documentation

## 2011-03-26 DIAGNOSIS — M545 Low back pain, unspecified: Secondary | ICD-10-CM | POA: Insufficient documentation

## 2011-03-26 DIAGNOSIS — IMO0001 Reserved for inherently not codable concepts without codable children: Secondary | ICD-10-CM | POA: Insufficient documentation

## 2011-04-03 ENCOUNTER — Ambulatory Visit: Payer: Medicaid Other | Attending: Orthopaedic Surgery | Admitting: Physical Therapy

## 2011-04-03 DIAGNOSIS — M545 Low back pain, unspecified: Secondary | ICD-10-CM | POA: Insufficient documentation

## 2011-04-03 DIAGNOSIS — IMO0001 Reserved for inherently not codable concepts without codable children: Secondary | ICD-10-CM | POA: Insufficient documentation

## 2011-04-03 DIAGNOSIS — R293 Abnormal posture: Secondary | ICD-10-CM | POA: Insufficient documentation

## 2011-04-03 DIAGNOSIS — M25559 Pain in unspecified hip: Secondary | ICD-10-CM | POA: Insufficient documentation

## 2011-04-10 ENCOUNTER — Ambulatory Visit: Payer: Medicaid Other | Admitting: Physical Therapy

## 2011-04-17 ENCOUNTER — Ambulatory Visit: Payer: Medicaid Other | Admitting: Physical Therapy

## 2011-10-24 ENCOUNTER — Other Ambulatory Visit (HOSPITAL_COMMUNITY): Payer: Self-pay | Admitting: Family Medicine

## 2011-10-24 DIAGNOSIS — Z1231 Encounter for screening mammogram for malignant neoplasm of breast: Secondary | ICD-10-CM

## 2011-12-04 ENCOUNTER — Ambulatory Visit (HOSPITAL_COMMUNITY)
Admission: RE | Admit: 2011-12-04 | Discharge: 2011-12-04 | Disposition: A | Discharge status: Home / Self Care | Payer: PRIVATE HEALTH INSURANCE | Source: Ambulatory Visit | Attending: Family Medicine | Admitting: Family Medicine

## 2011-12-04 DIAGNOSIS — Z1231 Encounter for screening mammogram for malignant neoplasm of breast: Secondary | ICD-10-CM | POA: Insufficient documentation

## 2012-05-20 ENCOUNTER — Other Ambulatory Visit (HOSPITAL_COMMUNITY)
Admission: RE | Admit: 2012-05-20 | Discharge: 2012-05-20 | Disposition: A | Discharge status: Home / Self Care | Payer: PRIVATE HEALTH INSURANCE | Source: Ambulatory Visit | Attending: Family Medicine | Admitting: Family Medicine

## 2012-05-20 ENCOUNTER — Other Ambulatory Visit: Payer: Self-pay | Admitting: Family Medicine

## 2012-05-20 DIAGNOSIS — Z124 Encounter for screening for malignant neoplasm of cervix: Secondary | ICD-10-CM | POA: Insufficient documentation

## 2012-11-11 ENCOUNTER — Other Ambulatory Visit (HOSPITAL_COMMUNITY): Payer: Self-pay | Admitting: Family Medicine

## 2012-11-11 DIAGNOSIS — Z1231 Encounter for screening mammogram for malignant neoplasm of breast: Secondary | ICD-10-CM

## 2012-12-04 ENCOUNTER — Ambulatory Visit (HOSPITAL_COMMUNITY)
Admission: RE | Admit: 2012-12-04 | Discharge: 2012-12-04 | Disposition: A | Discharge status: Home / Self Care | Payer: PRIVATE HEALTH INSURANCE | Source: Ambulatory Visit | Attending: Family Medicine | Admitting: Family Medicine

## 2012-12-04 DIAGNOSIS — Z1231 Encounter for screening mammogram for malignant neoplasm of breast: Secondary | ICD-10-CM | POA: Insufficient documentation

## 2012-12-18 ENCOUNTER — Encounter: Payer: Self-pay | Admitting: Podiatry

## 2012-12-18 ENCOUNTER — Ambulatory Visit (INDEPENDENT_AMBULATORY_CARE_PROVIDER_SITE_OTHER): Payer: Medicare Other | Admitting: Podiatry

## 2012-12-18 VITALS — BP 136/83 | HR 80 | Resp 16 | Ht 63.5 in | Wt 221.0 lb

## 2012-12-18 DIAGNOSIS — M79609 Pain in unspecified limb: Secondary | ICD-10-CM

## 2012-12-18 DIAGNOSIS — B351 Tinea unguium: Secondary | ICD-10-CM

## 2012-12-18 DIAGNOSIS — Q828 Other specified congenital malformations of skin: Secondary | ICD-10-CM

## 2012-12-18 DIAGNOSIS — E1149 Type 2 diabetes mellitus with other diabetic neurological complication: Secondary | ICD-10-CM

## 2012-12-18 NOTE — Progress Notes (Signed)
She presents today with a chief complaint of painful toenails and calluses bilateral foot.  Objective: Vital signs are stable she is alert and oriented x3. Pulses remain palpable bilateral. Multiple high per keratotic lesions porokeratotic lesions plantar aspect of the bilateral foot. Nails are thick yellow dystrophic onychomycotic and painful palpation.  Assessment: Pain in limb secondary onychomycosis and porokeratotic lesions.  Plan: Debridement of all reactive hyperkeratosis and debridement of nails 1 through 5 bilateral.

## 2013-03-13 ENCOUNTER — Encounter: Payer: Self-pay | Admitting: Podiatry

## 2013-03-13 ENCOUNTER — Ambulatory Visit (INDEPENDENT_AMBULATORY_CARE_PROVIDER_SITE_OTHER): Payer: Medicare Other | Admitting: Podiatry

## 2013-03-13 VITALS — BP 139/81 | HR 78 | Resp 12

## 2013-03-13 DIAGNOSIS — E119 Type 2 diabetes mellitus without complications: Secondary | ICD-10-CM

## 2013-03-13 DIAGNOSIS — B351 Tinea unguium: Secondary | ICD-10-CM

## 2013-03-13 DIAGNOSIS — M79609 Pain in unspecified limb: Secondary | ICD-10-CM

## 2013-03-13 DIAGNOSIS — Q828 Other specified congenital malformations of skin: Secondary | ICD-10-CM

## 2013-03-13 DIAGNOSIS — M7670 Peroneal tendinitis, unspecified leg: Secondary | ICD-10-CM

## 2013-03-13 NOTE — Progress Notes (Signed)
She presents today chief complaint of painful toenails and painful lesions plantar aspect the bilateral foot.  Objective: Vital signs are stable she is alert and oriented x3 he has a history of diabetes mellitus chief complaint is consistent with porokeratosis plantar aspect of the bilateral foot as well as reactive hyperkeratosis and thick yellow dystrophic clinically mycotic nails.  Assessment: Diabetes mellitus pain in limb secondary to onychomycosis and porokeratosis plantar aspect bilateral foot.  Plan: Debridement of nails 1 through 5 bilateral covered service secondary to pain.

## 2013-03-19 ENCOUNTER — Ambulatory Visit: Payer: Medicare Other | Admitting: Podiatry

## 2013-07-09 ENCOUNTER — Encounter: Payer: Self-pay | Admitting: Podiatry

## 2013-07-09 ENCOUNTER — Ambulatory Visit (INDEPENDENT_AMBULATORY_CARE_PROVIDER_SITE_OTHER): Payer: Medicare Other | Admitting: Podiatry

## 2013-07-09 VITALS — BP 125/73 | HR 88 | Resp 16

## 2013-07-09 DIAGNOSIS — M79673 Pain in unspecified foot: Secondary | ICD-10-CM

## 2013-07-09 DIAGNOSIS — M7671 Peroneal tendinitis, right leg: Secondary | ICD-10-CM

## 2013-07-09 DIAGNOSIS — M79609 Pain in unspecified limb: Secondary | ICD-10-CM

## 2013-07-09 DIAGNOSIS — E119 Type 2 diabetes mellitus without complications: Secondary | ICD-10-CM

## 2013-07-09 DIAGNOSIS — M7750 Other enthesopathy of unspecified foot: Secondary | ICD-10-CM

## 2013-07-09 DIAGNOSIS — B351 Tinea unguium: Secondary | ICD-10-CM

## 2013-07-09 NOTE — Progress Notes (Signed)
She presents today with a chief complaint of painful E. elongated toenails one through 5 bilateral. She also complaining of lateral peroneal pain right foot.  Objective: Vital signs are stable she is alert and oriented x3. Pulses are strongly palpable. She has pain on palpation peroneal brevis tendon right foot. Nails are thick yellow dystrophic onychomycotic painful palpation as well as debridement.  Assessment: Pain in limb secondary to onychomycosis 1 through 5 bilateral. Pain in limb secondary to diabetic peripheral neuropathy.. Tendinitis of the right foot.  Plan: Debridement nails 1 through 5 bilateral covered service secondary to pain. Injected along the peroneal tendon right foot. I will followup with her in 3 months.

## 2013-07-09 NOTE — Patient Instructions (Signed)
Diabetes and Foot Care Diabetes may cause you to have problems because of poor blood supply (circulation) to your feet and legs. This may cause the skin on your feet to become thinner, break easier, and heal more slowly. Your skin may become dry, and the skin may peel and crack. You may also have nerve damage in your legs and feet causing decreased feeling in them. You may not notice minor injuries to your feet that could lead to infections or more serious problems. Taking care of your feet is one of the most important things you can do for yourself.  HOME CARE INSTRUCTIONS  Wear shoes at all times, even in the house. Do not go barefoot. Bare feet are easily injured.  Check your feet daily for blisters, cuts, and redness. If you cannot see the bottom of your feet, use a mirror or ask someone for help.  Wash your feet with warm water (do not use hot water) and mild soap. Then pat your feet and the areas between your toes until they are completely dry. Do not soak your feet as this can dry your skin.  Apply a moisturizing lotion or petroleum jelly (that does not contain alcohol and is unscented) to the skin on your feet and to dry, brittle toenails. Do not apply lotion between your toes.  Trim your toenails straight across. Do not dig under them or around the cuticle. File the edges of your nails with an emery board or nail file.  Do not cut corns or calluses or try to remove them with medicine.  Wear clean socks or stockings every day. Make sure they are not too tight. Do not wear knee-high stockings since they may decrease blood flow to your legs.  Wear shoes that fit properly and have enough cushioning. To break in new shoes, wear them for just a few hours a day. This prevents you from injuring your feet. Always look in your shoes before you put them on to be sure there are no objects inside.  Do not cross your legs. This may decrease the blood flow to your feet.  If you find a minor scrape,  cut, or break in the skin on your feet, keep it and the skin around it clean and dry. These areas may be cleansed with mild soap and water. Do not cleanse the area with peroxide, alcohol, or iodine.  When you remove an adhesive bandage, be sure not to damage the skin around it.  If you have a wound, look at it several times a day to make sure it is healing.  Do not use heating pads or hot water bottles. They may burn your skin. If you have lost feeling in your feet or legs, you may not know it is happening until it is too late.  Make sure your health care provider performs a complete foot exam at least annually or more often if you have foot problems. Report any cuts, sores, or bruises to your health care provider immediately. SEEK MEDICAL CARE IF:   You have an injury that is not healing.  You have cuts or breaks in the skin.  You have an ingrown nail.  You notice redness on your legs or feet.  You feel burning or tingling in your legs or feet.  You have pain or cramps in your legs and feet.  Your legs or feet are numb.  Your feet always feel cold. SEEK IMMEDIATE MEDICAL CARE IF:   There is increasing redness,   swelling, or pain in or around a wound.  There is a red line that goes up your leg.  Pus is coming from a wound.  You develop a fever or as directed by your health care provider.  You notice a bad smell coming from an ulcer or wound. Document Released: 12/16/1999 Document Revised: 08/20/2012 Document Reviewed: 05/27/2012 Exit[employer] Patient Information 2015 [employer]. This information is not intended to replace advice given to you by your health care provider. Make sure you discuss any questions you have with your health care provider.  

## 2013-10-22 ENCOUNTER — Ambulatory Visit: Payer: Medicare Other

## 2013-10-22 ENCOUNTER — Ambulatory Visit: Payer: Medicare Other | Admitting: Podiatry

## 2013-10-30 ENCOUNTER — Other Ambulatory Visit (HOSPITAL_COMMUNITY): Payer: Self-pay | Admitting: Family Medicine

## 2013-10-30 DIAGNOSIS — Z1231 Encounter for screening mammogram for malignant neoplasm of breast: Secondary | ICD-10-CM

## 2013-12-07 ENCOUNTER — Ambulatory Visit (HOSPITAL_COMMUNITY)
Admission: RE | Admit: 2013-12-07 | Discharge: 2013-12-07 | Disposition: A | Discharge status: Home / Self Care | Payer: Medicare Other | Source: Ambulatory Visit | Attending: Family Medicine | Admitting: Family Medicine

## 2013-12-07 DIAGNOSIS — Z1231 Encounter for screening mammogram for malignant neoplasm of breast: Secondary | ICD-10-CM | POA: Insufficient documentation

## 2014-11-08 ENCOUNTER — Other Ambulatory Visit: Payer: Self-pay

## 2014-11-08 DIAGNOSIS — Z9289 Personal history of other medical treatment: Secondary | ICD-10-CM

## 2014-12-13 ENCOUNTER — Ambulatory Visit
Admission: RE | Admit: 2014-12-13 | Discharge: 2014-12-13 | Disposition: A | Discharge status: Home / Self Care | Payer: Medicare Other | Source: Ambulatory Visit

## 2014-12-13 DIAGNOSIS — Z9289 Personal history of other medical treatment: Secondary | ICD-10-CM

## 2015-05-24 ENCOUNTER — Other Ambulatory Visit (HOSPITAL_COMMUNITY)
Admission: RE | Admit: 2015-05-24 | Discharge: 2015-05-24 | Disposition: A | Discharge status: Home / Self Care | Payer: Medicare Other | Source: Ambulatory Visit | Attending: Family Medicine | Admitting: Family Medicine

## 2015-05-24 ENCOUNTER — Other Ambulatory Visit: Payer: Self-pay | Admitting: Family Medicine

## 2015-05-24 DIAGNOSIS — Z01419 Encounter for gynecological examination (general) (routine) without abnormal findings: Secondary | ICD-10-CM | POA: Insufficient documentation

## 2015-05-24 DIAGNOSIS — Z1151 Encounter for screening for human papillomavirus (HPV): Secondary | ICD-10-CM | POA: Insufficient documentation

## 2015-05-25 LAB — CYTOLOGY - PAP

## 2015-11-14 ENCOUNTER — Other Ambulatory Visit: Payer: Self-pay | Admitting: Family Medicine

## 2015-11-14 DIAGNOSIS — Z1231 Encounter for screening mammogram for malignant neoplasm of breast: Secondary | ICD-10-CM

## 2015-12-21 ENCOUNTER — Ambulatory Visit
Admission: RE | Admit: 2015-12-21 | Discharge: 2015-12-21 | Disposition: A | Discharge status: Home / Self Care | Payer: Medicare Other | Source: Ambulatory Visit | Attending: Family Medicine | Admitting: Family Medicine

## 2015-12-21 DIAGNOSIS — Z1231 Encounter for screening mammogram for malignant neoplasm of breast: Secondary | ICD-10-CM

## 2016-03-26 ENCOUNTER — Other Ambulatory Visit: Payer: Self-pay | Admitting: Family Medicine

## 2016-03-26 DIAGNOSIS — M5416 Radiculopathy, lumbar region: Secondary | ICD-10-CM

## 2016-03-26 DIAGNOSIS — M5126 Other intervertebral disc displacement, lumbar region: Secondary | ICD-10-CM

## 2016-03-26 DIAGNOSIS — M5136 Other intervertebral disc degeneration, lumbar region: Secondary | ICD-10-CM

## 2016-04-04 ENCOUNTER — Ambulatory Visit
Admission: RE | Admit: 2016-04-04 | Discharge: 2016-04-04 | Disposition: A | Discharge status: Home / Self Care | Payer: Medicare Other | Source: Ambulatory Visit | Attending: Family Medicine | Admitting: Family Medicine

## 2016-04-04 DIAGNOSIS — M5126 Other intervertebral disc displacement, lumbar region: Secondary | ICD-10-CM

## 2016-04-04 DIAGNOSIS — M5416 Radiculopathy, lumbar region: Secondary | ICD-10-CM

## 2016-04-04 DIAGNOSIS — M5136 Other intervertebral disc degeneration, lumbar region: Secondary | ICD-10-CM

## 2016-05-17 ENCOUNTER — Other Ambulatory Visit: Payer: Self-pay | Admitting: Pain Medicine

## 2016-05-17 ENCOUNTER — Ambulatory Visit
Admission: RE | Admit: 2016-05-17 | Discharge: 2016-05-17 | Disposition: A | Discharge status: Home / Self Care | Payer: Medicare Other | Source: Ambulatory Visit | Attending: Pain Medicine | Admitting: Pain Medicine

## 2016-05-17 ENCOUNTER — Other Ambulatory Visit: Payer: Self-pay | Admitting: Physician Assistant

## 2016-05-17 ENCOUNTER — Ambulatory Visit
Admission: RE | Admit: 2016-05-17 | Discharge: 2016-05-17 | Disposition: A | Discharge status: Home / Self Care | Payer: Medicare Other | Source: Ambulatory Visit | Attending: Physician Assistant | Admitting: Physician Assistant

## 2016-05-17 DIAGNOSIS — M25551 Pain in right hip: Secondary | ICD-10-CM

## 2016-11-13 ENCOUNTER — Other Ambulatory Visit: Payer: Self-pay | Admitting: Family Medicine

## 2016-11-13 DIAGNOSIS — Z1231 Encounter for screening mammogram for malignant neoplasm of breast: Secondary | ICD-10-CM

## 2016-11-21 ENCOUNTER — Other Ambulatory Visit: Payer: Self-pay | Admitting: Family Medicine

## 2016-11-21 DIAGNOSIS — N6310 Unspecified lump in the right breast, unspecified quadrant: Secondary | ICD-10-CM

## 2016-12-04 ENCOUNTER — Ambulatory Visit
Admission: RE | Admit: 2016-12-04 | Discharge: 2016-12-04 | Disposition: A | Discharge status: Home / Self Care | Payer: Medicare Other | Source: Ambulatory Visit | Attending: Family Medicine | Admitting: Family Medicine

## 2016-12-04 DIAGNOSIS — N6310 Unspecified lump in the right breast, unspecified quadrant: Secondary | ICD-10-CM

## 2016-12-24 ENCOUNTER — Other Ambulatory Visit: Payer: Self-pay | Admitting: Family Medicine

## 2016-12-24 DIAGNOSIS — Z1231 Encounter for screening mammogram for malignant neoplasm of breast: Secondary | ICD-10-CM

## 2017-01-21 ENCOUNTER — Ambulatory Visit: Payer: Medicare Other

## 2017-01-22 ENCOUNTER — Ambulatory Visit
Admission: RE | Admit: 2017-01-22 | Discharge: 2017-01-22 | Disposition: A | Discharge status: Home / Self Care | Payer: Medicare Other | Source: Ambulatory Visit | Attending: Family Medicine | Admitting: Family Medicine

## 2017-01-22 DIAGNOSIS — Z1231 Encounter for screening mammogram for malignant neoplasm of breast: Secondary | ICD-10-CM

## 2017-07-18 DIAGNOSIS — M479 Spondylosis, unspecified: Secondary | ICD-10-CM | POA: Insufficient documentation

## 2017-07-18 DIAGNOSIS — M545 Low back pain, unspecified: Secondary | ICD-10-CM | POA: Insufficient documentation

## 2017-07-25 DIAGNOSIS — D1039 Benign neoplasm of other parts of mouth: Secondary | ICD-10-CM | POA: Insufficient documentation

## 2017-07-25 DIAGNOSIS — G4733 Obstructive sleep apnea (adult) (pediatric): Secondary | ICD-10-CM | POA: Insufficient documentation

## 2017-07-25 DIAGNOSIS — R221 Localized swelling, mass and lump, neck: Secondary | ICD-10-CM | POA: Insufficient documentation

## 2017-07-25 DIAGNOSIS — J3489 Other specified disorders of nose and nasal sinuses: Secondary | ICD-10-CM | POA: Insufficient documentation

## 2017-09-13 NOTE — H&P (Signed)
TOTAL HIP ADMISSION H&P  Patient is admitted for right total hip arthroplasty.  Subjective:  Chief Complaint: right hip pain  HPI: Tamara Stokes, 62 y.o. female, has a history of pain and functional disability in the right hip(s) due to arthritis and patient has failed non-surgical conservative treatments for greater than 12 weeks to include NSAID's and/or analgesics, corticosteriod injections, use of assistive devices and activity modification.  Onset of symptoms was gradual starting 3 years ago with gradually worsening course since that time.The patient noted no past surgery on the right hip(s).  Patient currently rates pain in the right hip at 10 out of 10 with activity. Patient has night pain, worsening of pain with activity and weight bearing, pain that interfers with activities of daily living and crepitus. 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 This condition presents safety issues increasing the risk of falls. There is no current active infection.  There are no active problems to display for this patient.  Past Medical History:  Diagnosis Date  . Bilateral dry eyes   . Diabetes mellitus without complication ([place])   . Hypertension     Past Surgical History:  Procedure Laterality Date  . APPENDECTOMY    . CARPAL TUNNEL RELEASE Bilateral   . HERNIA REPAIR    . KNEE ARTHROSCOPY W/ LATERAL RELEASE Right   . MULTIPLE TOOTH EXTRACTIONS      No current facility-administered medications for this encounter.    Current Outpatient Medications  Medication Sig Dispense Refill Last Dose  . ACCU-CHEK AVIVA PLUS test strip 1 each by Other route as needed.    Taking  . ACCU-CHEK FASTCLIX LANCETS MISC    Taking  . Albuterol Sulfate (PROAIR HFA IN) Inhale 2 puffs into the lungs as needed.   Taking  . BENICAR HCT 40-25 MG per tablet Take 1 tablet by mouth daily.    Taking  . Blood  Glucose Monitoring Suppl (ACCU-CHEK NANO SMARTVIEW) W/DEVICE KIT    Taking  . Choline Fenofibrate (FENOFIBRIC ACID) 135 MG CPDR    Taking  . metFORMIN (GLUCOPHAGE) 500 MG tablet Take 500 mg by mouth 2 (two) times daily with a meal.    Taking  . RESTASIS 0.05 % ophthalmic emulsion Place 1 drop into both eyes 2 (two) times daily.    Taking   No Known Allergies  Social History   Tobacco Use  . Smoking status: Current Every Day Smoker    Packs/day: 0.20    Types: Cigarettes  . Smokeless tobacco: Never Used  Substance Use Topics  . Alcohol use: No    Family History  Problem Relation Age of Onset  . Diabetes Mother   . Kidney disease Mother   . Hypertension Mother   . Diabetes Father   . Hypertension Father      Review of Systems  Constitutional: Negative for chills and fever.  HENT: Negative for congestion, sore throat and tinnitus.   Eyes: Negative for double vision, photophobia and pain.  Respiratory: Negative for cough, shortness of breath and wheezing.   Cardiovascular: Negative for chest pain, palpitations and orthopnea.  Gastrointestinal: Negative for nausea and vomiting.  Genitourinary: Negative for dysuria, frequency and urgency.  Musculoskeletal: Positive for joint pain.  Neurological: Negative for dizziness, weakness and headaches.  Psychiatric/Behavioral: Negative for depression.    Objective:  Physical Exam  Well nourished and well developed. General: Alert and oriented x3, cooperative  and pleasant, no acute distress. Head: normocephalic, atraumatic, neck supple. Eyes: EOMI. Respiratory: breath sounds clear in all fields, no wheezing, rales, or rhonchi. Cardiovascular: Regular rate and rhythm, no murmurs, gallops or rubs.  Abdomen: non-tender to palpation and soft, normoactive bowel sounds. Musculoskeletal: Right Hip Exam: ROM: Flexion to 80, Internal Rotation 0, External Rotation 0, and abduction 0 with discomfort. There is no tenderness over the greater  trochanter.  Left Hip Exam: ROM: Flexion to 90, Internal Rotation 0, External Rotation 0, and abduction 0 with discomfort. There is no tenderness over the greater trochanter.  Calves soft and nontender. Motor function intact in LE. Strength 5/5 LE bilaterally. Neuro: Distal pulses 2+. Sensation to light touch intact in LE.  Vital signs in last 24 hours: Blood pressure: 132/78 mmHg Pulse: 74 bpm   Labs:   Estimated body mass index is 38.53 kg/m as calculated from the following:   Height as of 12/18/12: 5' 3.5" (1.613 m).   Weight as of 12/18/12: 100.2 kg.   Imaging Review Plain radiographs demonstrate severe degenerative joint disease of the right hip(s). The bone quality appears to be adequate for age and reported activity level.    Preoperative templating of the joint replacement has been completed, documented, and submitted to the Operating Room personnel in order to optimize intra-operative equipment management.     Assessment/Plan:  End stage arthritis, right hip(s)  The patient history, physical examination, clinical judgement of the provider and imaging studies are consistent with end stage degenerative joint disease of the right hip(s) and total hip arthroplasty is deemed medically necessary. The treatment options including medical management, injection therapy, arthroscopy and arthroplasty were discussed at length. The risks and benefits of total hip arthroplasty were presented and reviewed. The risks due to aseptic loosening, infection, stiffness, dislocation/subluxation,  thromboembolic complications and other imponderables were discussed.  The patient acknowledged the explanation, agreed to proceed with the plan and consent was signed. Patient is being admitted for inpatient treatment for surgery, pain control, PT, OT, prophylactic antibiotics, VTE prophylaxis, progressive ambulation and ADL's and discharge planning.The patient is planning to be discharged home with home  health services.  Therapy Plans: HHPT Disposition: Home with sister Planned DVT Prophylaxis: Aspirin 325 mg BID DME needed: Gilford Rile, 3-n-1 PCP: Dr. Donald Prose (medical clearance provided) TXA: IV Allergies: None HgbA1c: 6.3%  - Patient was instructed on what medications to stop prior to surgery. - Follow-up visit in 2 weeks with Dr. Wynelle Link - Begin physical therapy following surgery - Pre-operative lab work as pre-surgical testing - Prescriptions will be provided in hospital at time of discharge  Theresa Duty, PA-C Orthopedic Surgery [facility] [place]

## 2017-10-02 ENCOUNTER — Other Ambulatory Visit: Payer: Self-pay

## 2017-10-02 ENCOUNTER — Encounter (HOSPITAL_COMMUNITY)
Admission: RE | Admit: 2017-10-02 | Discharge: 2017-10-02 | Disposition: A | Discharge status: Home / Self Care | Payer: Medicare Other | Source: Ambulatory Visit | Attending: Orthopedic Surgery | Admitting: Orthopedic Surgery

## 2017-10-02 ENCOUNTER — Encounter (HOSPITAL_COMMUNITY): Payer: Self-pay | Admitting: *Deleted

## 2017-10-02 ENCOUNTER — Encounter (HOSPITAL_COMMUNITY): Payer: Self-pay

## 2017-10-02 DIAGNOSIS — Z8249 Family history of ischemic heart disease and other diseases of the circulatory system: Secondary | ICD-10-CM | POA: Insufficient documentation

## 2017-10-02 DIAGNOSIS — M1611 Unilateral primary osteoarthritis, right hip: Secondary | ICD-10-CM | POA: Diagnosis not present

## 2017-10-02 DIAGNOSIS — I10 Essential (primary) hypertension: Secondary | ICD-10-CM | POA: Insufficient documentation

## 2017-10-02 DIAGNOSIS — Z7984 Long term (current) use of oral hypoglycemic drugs: Secondary | ICD-10-CM | POA: Diagnosis not present

## 2017-10-02 DIAGNOSIS — F17210 Nicotine dependence, cigarettes, uncomplicated: Secondary | ICD-10-CM | POA: Insufficient documentation

## 2017-10-02 DIAGNOSIS — Z01818 Encounter for other preprocedural examination: Secondary | ICD-10-CM | POA: Insufficient documentation

## 2017-10-02 DIAGNOSIS — Z833 Family history of diabetes mellitus: Secondary | ICD-10-CM | POA: Diagnosis not present

## 2017-10-02 DIAGNOSIS — Z79899 Other long term (current) drug therapy: Secondary | ICD-10-CM | POA: Diagnosis not present

## 2017-10-02 DIAGNOSIS — Z7982 Long term (current) use of aspirin: Secondary | ICD-10-CM | POA: Diagnosis not present

## 2017-10-02 DIAGNOSIS — E119 Type 2 diabetes mellitus without complications: Secondary | ICD-10-CM | POA: Diagnosis not present

## 2017-10-02 HISTORY — DX: Presence of spectacles and contact lenses: Z97.3

## 2017-10-02 HISTORY — DX: Other symptoms and signs involving the musculoskeletal system: R29.898

## 2017-10-02 HISTORY — DX: Dependence on other enabling machines and devices: Z99.89

## 2017-10-02 HISTORY — DX: Unspecified asthma, uncomplicated: J45.909

## 2017-10-02 HISTORY — DX: Unspecified osteoarthritis, unspecified site: M19.90

## 2017-10-02 HISTORY — DX: Personal history of other diseases of the female genital tract: Z87.42

## 2017-10-02 HISTORY — DX: Other specified disorders of bone density and structure, unspecified site: M85.80

## 2017-10-02 HISTORY — DX: Other intervertebral disc degeneration, lumbar region without mention of lumbar back pain or lower extremity pain: M51.369

## 2017-10-02 HISTORY — DX: Obstructive sleep apnea (adult) (pediatric): G47.33

## 2017-10-02 HISTORY — DX: Pain in right hip: M25.552

## 2017-10-02 HISTORY — DX: Pain in right hip: M25.551

## 2017-10-02 HISTORY — DX: Mixed hyperlipidemia: E78.2

## 2017-10-02 HISTORY — DX: Other intervertebral disc degeneration, lumbar region: M51.36

## 2017-10-02 LAB — COMPREHENSIVE METABOLIC PANEL
ALK PHOS: 49 U/L (ref 38–126)
ALT: 15 U/L (ref 0–44)
ANION GAP: 11 (ref 5–15)
AST: 24 U/L (ref 15–41)
Albumin: 4.4 g/dL (ref 3.5–5.0)
BUN: 11 mg/dL (ref 8–23)
CALCIUM: 10.5 mg/dL — AB (ref 8.9–10.3)
CO2: 26 mmol/L (ref 22–32)
CREATININE: 0.65 mg/dL (ref 0.44–1.00)
Chloride: 108 mmol/L (ref 98–111)
Glucose, Bld: 82 mg/dL (ref 70–99)
Potassium: 4.1 mmol/L (ref 3.5–5.1)
Sodium: 145 mmol/L (ref 135–145)
Total Bilirubin: 0.7 mg/dL (ref 0.3–1.2)
Total Protein: 7.7 g/dL (ref 6.5–8.1)

## 2017-10-02 LAB — PROTIME-INR
INR: 0.89
PROTHROMBIN TIME: 12 s (ref 11.4–15.2)

## 2017-10-02 LAB — CBC
HCT: 38.5 % (ref 36.0–46.0)
Hemoglobin: 12.9 g/dL (ref 12.0–15.0)
MCH: 30.1 pg (ref 26.0–34.0)
MCHC: 33.5 g/dL (ref 30.0–36.0)
MCV: 89.7 fL (ref 78.0–100.0)
Platelets: 412 10*3/uL — ABNORMAL HIGH (ref 150–400)
RBC: 4.29 MIL/uL (ref 3.87–5.11)
RDW: 15.3 % (ref 11.5–15.5)
WBC: 8.3 10*3/uL (ref 4.0–10.5)

## 2017-10-02 LAB — ABO/RH: ABO/RH(D): O POS

## 2017-10-02 LAB — SURGICAL PCR SCREEN
MRSA, PCR: POSITIVE — AB
Staphylococcus aureus: POSITIVE — AB

## 2017-10-02 LAB — APTT: APTT: 30 s (ref 24–36)

## 2017-10-02 LAB — GLUCOSE, CAPILLARY: GLUCOSE-CAPILLARY: 84 mg/dL (ref 70–99)

## 2017-10-02 NOTE — Patient Instructions (Addendum)
Tamara Stokes  Sep 13, 1955    Your procedure is scheduled on:  Wednesday 10/09/2017   Report to [facility] Main  Entrance,   Report to admitting at   06:00 AM    Call this number if you have problems the morning of surgery [phone]               BRING YOUR CPAP TUBING AND MASK   How to Manage Your Diabetes Before and After Surgery  Why is it important to control my blood sugar before and after surgery? . Improving blood sugar levels before and after surgery helps healing and can limit problems. . A way of improving blood sugar control is eating a healthy diet by: o  Eating less sugar and carbohydrates o  Increasing activity/exercise o  Talking with your doctor about reaching your blood sugar goals . High blood sugars (greater than 180 mg/dL) can raise your risk of infections and slow your recovery, so you will need to focus on controlling your diabetes during the weeks before surgery. . Make sure that the doctor who takes care of your diabetes knows about your planned surgery including the date and location.  How do I manage my blood sugar before surgery? . Check your blood sugar at least 4 times a day, starting 2 days before surgery, to make sure that the level is not too high or low. o Check your blood sugar the morning of your surgery when you wake up and every 2 hours until you get to the Short Stay unit. . If your blood sugar is less than 70 mg/dL, you will need to treat for low blood sugar: o Do not take insulin. o Treat a low blood sugar (less than 70 mg/dL) with  cup of clear juice (cranberry or apple), 4 glucose tablets, OR glucose gel. o Recheck blood sugar in 15 minutes after treatment (to make sure it is greater than 70 mg/dL). If your blood sugar is not greater than 70 mg/dL on recheck, call [phone] for further instructions. . Report your blood sugar to the short stay nurse when you get to Short Stay.  . If you are admitted to  the hospital after surgery: o Your blood sugar will be checked by the staff and you will probably be given insulin after surgery (instead of oral diabetes medicines) to make sure you have good blood sugar levels. o The goal for blood sugar control after surgery is 80-180 mg/dL.   WHAT DO I DO ABOUT MY DIABETES MEDICATION?  Marland Kitchen Do not take oral diabetes medicines (pills) the morning of surgery.     Remember: Do not eat food or drink liquids :After Midnight.             BRUSH YOUR TEETH MORNING OF SURGERY AND RINSE YOUR MOUTH OUT, NO CHEWING GUM CANDY OR MINTS.     Take these medicines the morning of surgery with A SIP OF WATER: Choline Fenofibrate, Restasis eye drops, use Albuterol inhaler if needed and bring inhaler with you to the hospital                                 You may not have any metal on your body including hair pins and              piercings  Do not wear jewelry, make-up, lotions, powders or perfumes, deodorant                         Do not wear nail polish.  Do not shave  48 hours prior to surgery.              Do not bring valuables to the hospital. [place].  Contacts, dentures or bridgework may not be worn into surgery.  Leave suitcase in the car. After surgery it may be brought to your room.                 Please read over the following fact sheets you were given: _____________________________________________________________________             [facility] - Preparing for Surgery Before surgery, you can play an important role.  Because skin is not sterile, your skin needs to be as free of germs as possible.  You can reduce the number of germs on your skin by washing with CHG (chlorahexidine gluconate) soap before surgery.  CHG is an antiseptic cleaner which kills germs and bonds with the skin to continue killing germs even after washing. Please DO NOT use if you have an allergy to CHG or  antibacterial soaps.  If your skin becomes reddened/irritated stop using the CHG and inform your nurse when you arrive at Short Stay. Do not shave (including legs and underarms) for at least 48 hours prior to the first CHG shower.  You may shave your face/neck. Please follow these instructions carefully:  1.  Shower with CHG Soap the night before surgery and the  morning of Surgery.  2.  If you choose to wash your hair, wash your hair first as usual with your  normal  shampoo.  3.  After you shampoo, rinse your hair and body thoroughly to remove the  shampoo.                                        4.  Use CHG as you would any other liquid soap.  You can apply chg directly  to the skin and wash                       Gently with a scrungie or clean washcloth.  5.  Apply the CHG Soap to your body ONLY FROM THE NECK DOWN.   Do not use on face/ open                           Wound or open sores. Avoid contact with eyes, ears mouth and genitals (private parts).                       Wash face,  Genitals (private parts) with your normal soap.             6.  Wash thoroughly, paying special attention to the area where your surgery  will be performed.  7.  Thoroughly rinse your body with warm water from the neck down.  8.  DO NOT shower/wash with your normal soap after using and rinsing off  the CHG Soap.  9.  Pat yourself dry with a clean towel.            10.  Wear clean pajamas.            11.  Place clean sheets on your bed the night of your first shower and do not  sleep with pets. Day of Surgery : Do not apply any lotions/deodorants the morning of surgery.  Please wear clean clothes to the hospital/surgery center.  FAILURE TO FOLLOW THESE INSTRUCTIONS MAY RESULT IN THE CANCELLATION OF YOUR SURGERY PATIENT SIGNATURE_________________________________  NURSE SIGNATURE__________________________________       ____________________________________________Incentive Spirometer  An  incentive spirometer is a tool that can help keep your lungs clear and active. This tool measures how well you are filling your lungs with each breath. Taking long deep breaths may help reverse or decrease the chance of developing breathing (pulmonary) problems (especially infection) following:  A long period of time when you are unable to move or be active. BEFORE THE PROCEDURE   If the spirometer includes an indicator to show your best effort, your nurse or respiratory therapist will set it to a desired goal.  If possible, sit up straight or lean slightly forward. Try not to slouch.  Hold the incentive spirometer in an upright position. INSTRUCTIONS FOR USE  1. Sit on the edge of your bed if possible, or sit up as far as you can in bed or on a chair. 2. Hold the incentive spirometer in an upright position. 3. Breathe out normally. 4. Place the mouthpiece in your mouth and seal your lips tightly around it. 5. Breathe in slowly and as deeply as possible, raising the piston or the ball toward the top of the column. 6. Hold your breath for 3-5 seconds or for as long as possible. Allow the piston or ball to fall to the bottom of the column. 7. Remove the mouthpiece from your mouth and breathe out normally. 8. Rest for a few seconds and repeat Steps 1 through 7 at least 10 times every 1-2 hours when you are awake. Take your time and take a few normal breaths between deep breaths. 9. The spirometer may include an indicator to show your best effort. Use the indicator as a goal to work toward during each repetition. 10. After each set of 10 deep breaths, practice coughing to be sure your lungs are clear. If you have an incision (the cut made at the time of surgery), support your incision when coughing by placing a pillow or rolled up towels firmly against it. Once you are able to get out of bed, walk around indoors and cough well. You may stop using the incentive spirometer when instructed by your  caregiver.  RISKS AND COMPLICATIONS  Take your time so you do not get dizzy or light-headed.  If you are in pain, you may need to take or ask for pain medication before doing incentive spirometry. It is harder to take a deep breath if you are having pain. AFTER USE  Rest and breathe slowly and easily.  It can be helpful to keep track of a log of your progress. Your caregiver can provide you with a simple table to help with this. If you are using the spirometer at home, follow these instructions: [place] IF:   You are having difficultly using the spirometer.  You have trouble using the spirometer as often as instructed.  Your pain medication is not giving enough relief while using the spirometer.  You develop fever of 100.5 F (38.1 C) or higher. SEEK IMMEDIATE MEDICAL CARE IF:   You cough up bloody sputum that had not been present before.  You develop fever of 102 F (38.9 C) or greater.  You develop worsening pain at or near the incision site. MAKE SURE YOU:   Understand these instructions.  Will watch your condition.  Will get help right away if you are not doing well or get worse. Document Released: 04/30/2006 Document Revised: 03/12/2011 Document Reviewed: 07/01/2006 Exit[employer] Patient Information 2014 [employer], [place].     _________________________________________WHAT IS A BLOOD TRANSFUSION? Blood Transfusion Information  A transfusion is the replacement of blood or some of its parts. Blood is made up of multiple cells which provide different functions.  Red blood cells carry oxygen and are used for blood loss replacement.  White blood cells fight against infection.  Platelets control bleeding.  Plasma helps clot blood.  Other blood products are available for specialized needs, such as hemophilia or other clotting disorders. BEFORE THE TRANSFUSION  Who gives blood for transfusions?   Healthy volunteers who are fully evaluated to make sure their  blood is safe. This is blood bank blood. Transfusion therapy is the safest it has ever been in the practice of medicine. Before blood is taken from a donor, a complete history is taken to make sure that person has no history of diseases nor engages in risky social behavior (examples are intravenous drug use or sexual activity with multiple partners). The donor's travel history is screened to minimize risk of transmitting infections, such as malaria. The donated blood is tested for signs of infectious diseases, such as HIV and hepatitis. The blood is then tested to be sure it is compatible with you in order to minimize the chance of a transfusion reaction. If you or a relative donates blood, this is often done in anticipation of surgery and is not appropriate for emergency situations. It takes many days to process the donated blood. RISKS AND COMPLICATIONS Although transfusion therapy is very safe and saves many lives, the main dangers of transfusion include:   Getting an infectious disease.  Developing a transfusion reaction. This is an allergic reaction to something in the blood you were given. Every precaution is taken to prevent this. The decision to have a blood transfusion has been considered carefully by your caregiver before blood is given. Blood is not given unless the benefits outweigh the risks. AFTER THE TRANSFUSION  Right after receiving a blood transfusion, you will usually feel much better and more energetic. This is especially true if your red blood cells have gotten low (anemic). The transfusion raises the level of the red blood cells which carry oxygen, and this usually causes an energy increase.  The nurse administering the transfusion will monitor you carefully for complications. HOME CARE INSTRUCTIONS  No special instructions are needed after a transfusion. You may find your energy is better. Speak with your caregiver about any limitations on activity for underlying diseases you  may have. SEEK MEDICAL CARE IF:   Your condition is not improving after your transfusion.  You develop redness or irritation at the intravenous (IV) site. SEEK IMMEDIATE MEDICAL CARE IF:  Any of the following symptoms occur over the next 12 hours:  Shaking chills.  You have a temperature by mouth above 102 F (38.9 C), not controlled by medicine.  Chest, back, or muscle pain.  People around you feel you are not acting correctly or are confused.  Shortness  of breath or difficulty breathing.  Dizziness and fainting.  You get a rash or develop hives.  You have a decrease in urine output.  Your urine turns a dark color or changes to pink, red, or brown. Any of the following symptoms occur over the next 10 days:  You have a temperature by mouth above 102 F (38.9 C), not controlled by medicine.  Shortness of breath.  Weakness after normal activity.  The white part of the eye turns yellow (jaundice).  You have a decrease in the amount of urine or are urinating less often.  Your urine turns a dark color or changes to pink, red, or brown. Document Released: 12/16/1999 Document Revised: 03/12/2011 Document Reviewed: 08/04/2007 [facility] Patient Information 2014 [place], [place].  _______________________________________________________________________

## 2017-10-02 NOTE — Progress Notes (Signed)
07/19/2017- Medical Clearance from Dr. Foye Deer on chart  05/22/2017- Office visit from Dr. Foye Deer on chart with HgA1C-6.4

## 2017-10-02 NOTE — Progress Notes (Addendum)
Pt CBG today at PAT visit was 84. Pt stated she has eaten and took metformin, declined anything to drink / eat.  Final EKG dated 10-02-2017 in epic.  Positive PCR screen dated 10-02-2017 sent to dr Wynelle Link in epic.

## 2017-10-03 LAB — HEMOGLOBIN A1C
Hgb A1c MFr Bld: 5.9 % — ABNORMAL HIGH (ref 4.8–5.6)
Mean Plasma Glucose: 123 mg/dL

## 2017-10-08 NOTE — Anesthesia Preprocedure Evaluation (Addendum)
Anesthesia Evaluation  Patient identified by MRN, date of birth, ID band Patient awake    Reviewed: Allergy & Precautions, NPO status , Patient's Chart, lab work & pertinent test results  Airway Mallampati: II  TM Distance: >3 FB Neck ROM: Full    Dental  (+) Dental Advisory Given, Missing   Pulmonary asthma , sleep apnea and Continuous Positive Airway Pressure Ventilation , Current Smoker,    Pulmonary exam normal breath sounds clear to auscultation       Cardiovascular hypertension, Pt. on medications (-) angina(-) CAD and (-) Past MI Normal cardiovascular exam Rhythm:Regular Rate:Normal     Neuro/Psych negative neurological ROS     GI/Hepatic negative GI ROS, Neg liver ROS,   Endo/Other  diabetes, Type 2, Oral Hypoglycemic AgentsObesity   Renal/GU negative Renal ROS     Musculoskeletal  (+) Arthritis , Osteoarthritis,    Abdominal   Peds  Hematology negative hematology ROS (+) Plt 412k on 10/02/17   Anesthesia Other Findings Day of surgery medications reviewed with the patient.  Reproductive/Obstetrics                           Anesthesia Physical Anesthesia Plan  ASA: II  Anesthesia Plan: Spinal   Post-op Pain Management:    Induction:   PONV Risk Score and Plan: 2 and Propofol infusion, Ondansetron and Midazolam  Airway Management Planned: Natural Airway and Simple Face Mask  Additional Equipment:   Intra-op Plan:   Post-operative Plan:   Informed Consent: I have reviewed the patients History and Physical, chart, labs and discussed the procedure including the risks, benefits and alternatives for the proposed anesthesia with the patient or authorized representative who has indicated his/her understanding and acceptance.   Dental advisory given  Plan Discussed with: CRNA  Anesthesia Plan Comments: (Plan for spinal anesthesia.)       Anesthesia Quick Evaluation

## 2017-10-09 ENCOUNTER — Encounter (HOSPITAL_COMMUNITY)
Admission: RE | Disposition: A | Discharge status: Home / Self Care | Payer: Self-pay | Source: Ambulatory Visit | Attending: Orthopedic Surgery

## 2017-10-09 ENCOUNTER — Encounter (HOSPITAL_COMMUNITY): Payer: Self-pay

## 2017-10-09 ENCOUNTER — Inpatient Hospital Stay (HOSPITAL_COMMUNITY): Payer: Medicare Other

## 2017-10-09 ENCOUNTER — Inpatient Hospital Stay (HOSPITAL_COMMUNITY): Payer: Medicare Other | Admitting: Anesthesiology

## 2017-10-09 ENCOUNTER — Inpatient Hospital Stay (HOSPITAL_COMMUNITY)
Admission: RE | Admit: 2017-10-09 | Discharge: 2017-10-11 | DRG: 470 | Disposition: A | Discharge status: Home / Self Care | Payer: Medicare Other | Source: Ambulatory Visit | Attending: Orthopedic Surgery | Admitting: Orthopedic Surgery

## 2017-10-09 ENCOUNTER — Other Ambulatory Visit: Payer: Self-pay

## 2017-10-09 DIAGNOSIS — E119 Type 2 diabetes mellitus without complications: Secondary | ICD-10-CM | POA: Diagnosis present

## 2017-10-09 DIAGNOSIS — Z9049 Acquired absence of other specified parts of digestive tract: Secondary | ICD-10-CM | POA: Diagnosis not present

## 2017-10-09 DIAGNOSIS — J45909 Unspecified asthma, uncomplicated: Secondary | ICD-10-CM | POA: Diagnosis present

## 2017-10-09 DIAGNOSIS — I10 Essential (primary) hypertension: Secondary | ICD-10-CM | POA: Diagnosis present

## 2017-10-09 DIAGNOSIS — Z96649 Presence of unspecified artificial hip joint: Secondary | ICD-10-CM

## 2017-10-09 DIAGNOSIS — Z841 Family history of disorders of kidney and ureter: Secondary | ICD-10-CM

## 2017-10-09 DIAGNOSIS — F17200 Nicotine dependence, unspecified, uncomplicated: Secondary | ICD-10-CM | POA: Diagnosis present

## 2017-10-09 DIAGNOSIS — G4733 Obstructive sleep apnea (adult) (pediatric): Secondary | ICD-10-CM | POA: Diagnosis present

## 2017-10-09 DIAGNOSIS — E782 Mixed hyperlipidemia: Secondary | ICD-10-CM | POA: Diagnosis present

## 2017-10-09 DIAGNOSIS — M169 Osteoarthritis of hip, unspecified: Secondary | ICD-10-CM | POA: Diagnosis present

## 2017-10-09 DIAGNOSIS — M8580 Other specified disorders of bone density and structure, unspecified site: Secondary | ICD-10-CM | POA: Diagnosis present

## 2017-10-09 DIAGNOSIS — Z8249 Family history of ischemic heart disease and other diseases of the circulatory system: Secondary | ICD-10-CM

## 2017-10-09 DIAGNOSIS — M160 Bilateral primary osteoarthritis of hip: Principal | ICD-10-CM | POA: Diagnosis present

## 2017-10-09 DIAGNOSIS — Z833 Family history of diabetes mellitus: Secondary | ICD-10-CM

## 2017-10-09 HISTORY — DX: Osteoarthritis of hip, unspecified: M16.9

## 2017-10-09 HISTORY — DX: Type 2 diabetes mellitus without complications: E11.9

## 2017-10-09 HISTORY — PX: TOTAL HIP ARTHROPLASTY: SHX124

## 2017-10-09 LAB — TYPE AND SCREEN
ABO/RH(D): O POS
Antibody Screen: NEGATIVE

## 2017-10-09 LAB — GLUCOSE, CAPILLARY
GLUCOSE-CAPILLARY: 99 mg/dL (ref 70–99)
GLUCOSE-CAPILLARY: 87 mg/dL (ref 70–99)
GLUCOSE-CAPILLARY: 201 mg/dL — AB (ref 70–99)
Glucose-Capillary: 76 mg/dL (ref 70–99)
Glucose-Capillary: 193 mg/dL — ABNORMAL HIGH (ref 70–99)

## 2017-10-09 SURGERY — ARTHROPLASTY, HIP, TOTAL, ANTERIOR APPROACH
Anesthesia: Spinal | Site: Hip | Laterality: Right

## 2017-10-09 MED ORDER — ONDANSETRON HCL 4 MG/2ML IJ SOLN
INTRAMUSCULAR | Status: AC
  Filled 2017-10-09: qty 4

## 2017-10-09 MED ORDER — LOSARTAN POTASSIUM 50 MG PO TABS
100.0000 mg | ORAL_TABLET | Freq: Every day | ORAL | Status: DC
  Administered 2017-10-10 – 2017-10-11 (×2): 100 mg via ORAL
  Filled 2017-10-09 (×2): qty 2

## 2017-10-09 MED ORDER — METHOCARBAMOL 500 MG IVPB - SIMPLE MED
500.0000 mg | Freq: Four times a day (QID) | INTRAVENOUS | Status: DC | PRN
  Filled 2017-10-09: qty 50

## 2017-10-09 MED ORDER — METOCLOPRAMIDE HCL 5 MG PO TABS: 5.0000 mg | ORAL_TABLET | Freq: Three times a day (TID) | ORAL | Status: DC | PRN

## 2017-10-09 MED ORDER — FENTANYL CITRATE (PF) 250 MCG/5ML IJ SOLN
INTRAMUSCULAR | Status: DC | PRN
  Administered 2017-10-09 (×2): 25 ug via INTRAVENOUS
  Administered 2017-10-09: 50 ug via INTRAVENOUS
  Administered 2017-10-09: 100 ug via INTRAVENOUS

## 2017-10-09 MED ORDER — FENTANYL CITRATE (PF) 100 MCG/2ML IJ SOLN
INTRAMUSCULAR | Status: AC
  Administered 2017-10-09: 25 ug via INTRAVENOUS
  Filled 2017-10-09: qty 2

## 2017-10-09 MED ORDER — LOSARTAN POTASSIUM-HCTZ 100-25 MG PO TABS: 1.0000 | ORAL_TABLET | Freq: Every morning | ORAL | Status: DC

## 2017-10-09 MED ORDER — PROPOFOL 10 MG/ML IV BOLUS
INTRAVENOUS | Status: AC
  Filled 2017-10-09: qty 20

## 2017-10-09 MED ORDER — VANCOMYCIN HCL IN DEXTROSE 1-5 GM/200ML-% IV SOLN
INTRAVENOUS | Status: AC
  Administered 2017-10-09: 1000 mg
  Filled 2017-10-09: qty 200

## 2017-10-09 MED ORDER — BUPIVACAINE HCL (PF) 0.25 % IJ SOLN
INTRAMUSCULAR | Status: DC | PRN
  Administered 2017-10-09: 30 mL

## 2017-10-09 MED ORDER — DEXAMETHASONE SODIUM PHOSPHATE 10 MG/ML IJ SOLN
INTRAMUSCULAR | Status: DC | PRN
  Administered 2017-10-09: 8 mg via INTRAVENOUS

## 2017-10-09 MED ORDER — SODIUM CHLORIDE 0.9 % IV SOLN
INTRAVENOUS | Status: DC
  Administered 2017-10-09 – 2017-10-10 (×2): via INTRAVENOUS

## 2017-10-09 MED ORDER — POLYETHYLENE GLYCOL 3350 17 G PO PACK: 17.0000 g | PACK | Freq: Every day | ORAL | Status: DC | PRN

## 2017-10-09 MED ORDER — PHENYLEPHRINE 40 MCG/ML (10ML) SYRINGE FOR IV PUSH (FOR BLOOD PRESSURE SUPPORT)
PREFILLED_SYRINGE | INTRAVENOUS | Status: AC
  Filled 2017-10-09: qty 30

## 2017-10-09 MED ORDER — ROCURONIUM BROMIDE 100 MG/10ML IV SOLN
INTRAVENOUS | Status: AC
  Filled 2017-10-09: qty 1

## 2017-10-09 MED ORDER — PANTOPRAZOLE SODIUM 40 MG PO TBEC
80.0000 mg | DELAYED_RELEASE_TABLET | Freq: Every day | ORAL | Status: DC
  Administered 2017-10-10 – 2017-10-11 (×2): 80 mg via ORAL
  Filled 2017-10-09 (×3): qty 2

## 2017-10-09 MED ORDER — TRANEXAMIC ACID 1000 MG/10ML IV SOLN
1000.0000 mg | INTRAVENOUS | Status: AC
  Administered 2017-10-09: 1000 mg via INTRAVENOUS

## 2017-10-09 MED ORDER — MORPHINE SULFATE (PF) 2 MG/ML IV SOLN: 0.5000 mg | INTRAVENOUS | Status: DC | PRN

## 2017-10-09 MED ORDER — METHOCARBAMOL 500 MG PO TABS
500.0000 mg | ORAL_TABLET | Freq: Four times a day (QID) | ORAL | Status: DC | PRN
  Administered 2017-10-09 – 2017-10-11 (×5): 500 mg via ORAL
  Filled 2017-10-09 (×5): qty 1

## 2017-10-09 MED ORDER — CYCLOSPORINE 0.05 % OP EMUL
1.0000 [drp] | Freq: Two times a day (BID) | OPHTHALMIC | Status: DC
  Administered 2017-10-09 – 2017-10-11 (×3): 1 [drp] via OPHTHALMIC
  Filled 2017-10-09 (×5): qty 1

## 2017-10-09 MED ORDER — PHENYLEPHRINE 40 MCG/ML (10ML) SYRINGE FOR IV PUSH (FOR BLOOD PRESSURE SUPPORT)
PREFILLED_SYRINGE | INTRAVENOUS | Status: DC | PRN
  Administered 2017-10-09 (×3): 80 ug via INTRAVENOUS

## 2017-10-09 MED ORDER — SUCCINYLCHOLINE CHLORIDE 200 MG/10ML IV SOSY
PREFILLED_SYRINGE | INTRAVENOUS | Status: AC
  Filled 2017-10-09: qty 20

## 2017-10-09 MED ORDER — INSULIN ASPART 100 UNIT/ML ~~LOC~~ SOLN
0.0000 [IU] | Freq: Three times a day (TID) | SUBCUTANEOUS | Status: DC
  Administered 2017-10-09: 3 [IU] via SUBCUTANEOUS
  Administered 2017-10-10: 5 [IU] via SUBCUTANEOUS
  Administered 2017-10-10 (×2): 2 [IU] via SUBCUTANEOUS

## 2017-10-09 MED ORDER — HYDROCODONE-ACETAMINOPHEN 5-325 MG PO TABS
1.0000 | ORAL_TABLET | ORAL | Status: DC | PRN
  Administered 2017-10-09: 2 via ORAL
  Administered 2017-10-09: 1 via ORAL
  Administered 2017-10-09 – 2017-10-11 (×8): 2 via ORAL
  Filled 2017-10-09: qty 1
  Filled 2017-10-09 (×9): qty 2

## 2017-10-09 MED ORDER — CEFAZOLIN SODIUM-DEXTROSE 2-4 GM/100ML-% IV SOLN
INTRAVENOUS | Status: AC
  Administered 2017-10-09: 2 g via INTRAVENOUS
  Filled 2017-10-09: qty 100

## 2017-10-09 MED ORDER — BUPIVACAINE IN DEXTROSE 0.75-8.25 % IT SOLN
INTRATHECAL | Status: DC | PRN
  Administered 2017-10-09: 1.6 mL via INTRATHECAL

## 2017-10-09 MED ORDER — PROPOFOL 500 MG/50ML IV EMUL
INTRAVENOUS | Status: DC | PRN
  Administered 2017-10-09: 75 ug/kg/min via INTRAVENOUS

## 2017-10-09 MED ORDER — PROMETHAZINE HCL 25 MG/ML IJ SOLN: 6.2500 mg | INTRAMUSCULAR | Status: DC | PRN

## 2017-10-09 MED ORDER — PROPOFOL 10 MG/ML IV BOLUS
INTRAVENOUS | Status: DC | PRN
  Administered 2017-10-09 (×4): 20 mg via INTRAVENOUS

## 2017-10-09 MED ORDER — ACETAMINOPHEN 10 MG/ML IV SOLN
1000.0000 mg | Freq: Four times a day (QID) | INTRAVENOUS | Status: DC
  Administered 2017-10-09: 1000 mg via INTRAVENOUS

## 2017-10-09 MED ORDER — DEXAMETHASONE SODIUM PHOSPHATE 10 MG/ML IJ SOLN
10.0000 mg | Freq: Once | INTRAMUSCULAR | Status: AC
  Administered 2017-10-10: 10 mg via INTRAVENOUS
  Filled 2017-10-09: qty 1

## 2017-10-09 MED ORDER — MIDAZOLAM HCL 2 MG/2ML IJ SOLN
INTRAMUSCULAR | Status: DC | PRN
  Administered 2017-10-09 (×2): 1 mg via INTRAVENOUS

## 2017-10-09 MED ORDER — CEFAZOLIN SODIUM-DEXTROSE 2-4 GM/100ML-% IV SOLN
2.0000 g | Freq: Four times a day (QID) | INTRAVENOUS | Status: AC
  Administered 2017-10-09 (×2): 2 g via INTRAVENOUS
  Filled 2017-10-09 (×2): qty 100

## 2017-10-09 MED ORDER — BUPIVACAINE HCL (PF) 0.25 % IJ SOLN
INTRAMUSCULAR | Status: AC
  Filled 2017-10-09: qty 30

## 2017-10-09 MED ORDER — TRAMADOL HCL 50 MG PO TABS
50.0000 mg | ORAL_TABLET | Freq: Four times a day (QID) | ORAL | Status: DC | PRN
  Administered 2017-10-10: 100 mg via ORAL
  Filled 2017-10-09: qty 2

## 2017-10-09 MED ORDER — ACETAMINOPHEN 10 MG/ML IV SOLN
INTRAVENOUS | Status: AC
  Filled 2017-10-09: qty 100

## 2017-10-09 MED ORDER — METOCLOPRAMIDE HCL 5 MG/ML IJ SOLN: 5.0000 mg | Freq: Three times a day (TID) | INTRAMUSCULAR | Status: DC | PRN

## 2017-10-09 MED ORDER — MIDAZOLAM HCL 2 MG/2ML IJ SOLN
INTRAMUSCULAR | Status: AC
  Filled 2017-10-09: qty 2

## 2017-10-09 MED ORDER — DOCUSATE SODIUM 100 MG PO CAPS
100.0000 mg | ORAL_CAPSULE | Freq: Two times a day (BID) | ORAL | Status: DC
  Administered 2017-10-09 – 2017-10-11 (×4): 100 mg via ORAL
  Filled 2017-10-09 (×4): qty 1

## 2017-10-09 MED ORDER — ALBUTEROL SULFATE (2.5 MG/3ML) 0.083% IN NEBU: 2.5000 mg | INHALATION_SOLUTION | Freq: Four times a day (QID) | RESPIRATORY_TRACT | Status: DC | PRN

## 2017-10-09 MED ORDER — HYDROCHLOROTHIAZIDE 25 MG PO TABS
25.0000 mg | ORAL_TABLET | Freq: Every day | ORAL | Status: DC
  Administered 2017-10-10 – 2017-10-11 (×2): 25 mg via ORAL
  Filled 2017-10-09 (×2): qty 1

## 2017-10-09 MED ORDER — FENTANYL CITRATE (PF) 250 MCG/5ML IJ SOLN
INTRAMUSCULAR | Status: AC
  Filled 2017-10-09: qty 5

## 2017-10-09 MED ORDER — CEFAZOLIN SODIUM-DEXTROSE 2-4 GM/100ML-% IV SOLN
2.0000 g | INTRAVENOUS | Status: AC
  Administered 2017-10-09: 2 g via INTRAVENOUS

## 2017-10-09 MED ORDER — DEXAMETHASONE SODIUM PHOSPHATE 10 MG/ML IJ SOLN
INTRAMUSCULAR | Status: AC
  Filled 2017-10-09: qty 2

## 2017-10-09 MED ORDER — BUPIVACAINE-EPINEPHRINE (PF) 0.5% -1:200000 IJ SOLN
INTRAMUSCULAR | Status: AC
  Filled 2017-10-09: qty 30

## 2017-10-09 MED ORDER — STERILE WATER FOR IRRIGATION IR SOLN
Status: DC | PRN
  Administered 2017-10-09: 2000 mL

## 2017-10-09 MED ORDER — ASPIRIN EC 325 MG PO TBEC
325.0000 mg | DELAYED_RELEASE_TABLET | Freq: Every day | ORAL | Status: DC
  Administered 2017-10-10 – 2017-10-11 (×2): 325 mg via ORAL
  Filled 2017-10-09 (×2): qty 1

## 2017-10-09 MED ORDER — TRANEXAMIC ACID 1000 MG/10ML IV SOLN
INTRAVENOUS | Status: AC
  Filled 2017-10-09: qty 10

## 2017-10-09 MED ORDER — LACTATED RINGERS IV SOLN
INTRAVENOUS | Status: DC
  Administered 2017-10-09: 10:00:00 via INTRAVENOUS
  Administered 2017-10-09: 1000 mL via INTRAVENOUS
  Administered 2017-10-09: 09:00:00 via INTRAVENOUS

## 2017-10-09 MED ORDER — CHLORHEXIDINE GLUCONATE 4 % EX LIQD: 60.0000 mL | Freq: Once | CUTANEOUS | Status: DC

## 2017-10-09 MED ORDER — LIDOCAINE HCL (CARDIAC) PF 100 MG/5ML IV SOSY
PREFILLED_SYRINGE | INTRAVENOUS | Status: AC
  Filled 2017-10-09: qty 10

## 2017-10-09 MED ORDER — FENTANYL CITRATE (PF) 100 MCG/2ML IJ SOLN
25.0000 ug | INTRAMUSCULAR | Status: DC | PRN
  Administered 2017-10-09: 25 ug via INTRAVENOUS
  Administered 2017-10-09: 50 ug via INTRAVENOUS

## 2017-10-09 MED ORDER — 0.9 % SODIUM CHLORIDE (POUR BTL) OPTIME
TOPICAL | Status: DC | PRN
  Administered 2017-10-09: 1000 mL

## 2017-10-09 MED ORDER — MENTHOL 3 MG MT LOZG: 1.0000 | LOZENGE | OROMUCOSAL | Status: DC | PRN

## 2017-10-09 MED ORDER — TRANEXAMIC ACID-NACL 1000-0.7 MG/100ML-% IV SOLN
1000.0000 mg | Freq: Once | INTRAVENOUS | Status: AC
  Administered 2017-10-09: 1000 mg via INTRAVENOUS
  Filled 2017-10-09: qty 100
  Filled 2017-10-09: qty 1000

## 2017-10-09 MED ORDER — ONDANSETRON HCL 4 MG/2ML IJ SOLN: 4.0000 mg | Freq: Four times a day (QID) | INTRAMUSCULAR | Status: DC | PRN

## 2017-10-09 MED ORDER — ONDANSETRON HCL 4 MG PO TABS: 4.0000 mg | ORAL_TABLET | Freq: Four times a day (QID) | ORAL | Status: DC | PRN

## 2017-10-09 MED ORDER — DIPHENHYDRAMINE HCL 12.5 MG/5ML PO ELIX: 12.5000 mg | ORAL_SOLUTION | ORAL | Status: DC | PRN

## 2017-10-09 MED ORDER — SODIUM CHLORIDE 0.9 % IV SOLN
INTRAVENOUS | Status: DC | PRN
  Administered 2017-10-09: 25 ug/min via INTRAVENOUS

## 2017-10-09 MED ORDER — LIDOCAINE 2% (20 MG/ML) 5 ML SYRINGE
INTRAMUSCULAR | Status: DC | PRN
  Administered 2017-10-09: 40 mg via INTRAVENOUS

## 2017-10-09 MED ORDER — BISACODYL 10 MG RE SUPP: 10.0000 mg | Freq: Every day | RECTAL | Status: DC | PRN

## 2017-10-09 MED ORDER — ONDANSETRON HCL 4 MG/2ML IJ SOLN
INTRAMUSCULAR | Status: DC | PRN
  Administered 2017-10-09: 4 mg via INTRAVENOUS

## 2017-10-09 MED ORDER — VANCOMYCIN HCL IN DEXTROSE 1-5 GM/200ML-% IV SOLN: 1000.0000 mg | INTRAVENOUS | Status: DC

## 2017-10-09 MED ORDER — ALBUTEROL SULFATE HFA 108 (90 BASE) MCG/ACT IN AERS
INHALATION_SPRAY | RESPIRATORY_TRACT | Status: AC
  Filled 2017-10-09: qty 6.7

## 2017-10-09 MED ORDER — FLEET ENEMA 7-19 GM/118ML RE ENEM: 1.0000 | ENEMA | Freq: Once | RECTAL | Status: DC | PRN

## 2017-10-09 MED ORDER — PHENOL 1.4 % MT LIQD
1.0000 | OROMUCOSAL | Status: DC | PRN
  Filled 2017-10-09: qty 177

## 2017-10-09 MED ORDER — EPHEDRINE 5 MG/ML INJ
INTRAVENOUS | Status: AC
  Filled 2017-10-09: qty 20

## 2017-10-09 MED ORDER — ACETAMINOPHEN 500 MG PO TABS
500.0000 mg | ORAL_TABLET | Freq: Four times a day (QID) | ORAL | Status: AC
  Administered 2017-10-09 – 2017-10-10 (×3): 500 mg via ORAL
  Filled 2017-10-09 (×3): qty 1

## 2017-10-09 MED ORDER — PHENYLEPHRINE HCL 10 MG/ML IJ SOLN
INTRAMUSCULAR | Status: AC
  Filled 2017-10-09: qty 3

## 2017-10-09 MED ORDER — DEXAMETHASONE SODIUM PHOSPHATE 10 MG/ML IJ SOLN: 8.0000 mg | Freq: Once | INTRAMUSCULAR | Status: DC

## 2017-10-09 SURGICAL SUPPLY — 48 items
BAG DECANTER FOR FLEXI CONT (MISCELLANEOUS) ×3 IMPLANT
BAG SPEC THK2 15X12 ZIP CLS (MISCELLANEOUS)
BAG ZIPLOCK 12X15 (MISCELLANEOUS) IMPLANT
BLADE SAG 18X100X1.27 (BLADE) ×3 IMPLANT
CLOSURE WOUND 1/2 X4 (GAUZE/BANDAGES/DRESSINGS) ×1
COVER PERINEAL POST (MISCELLANEOUS) ×3 IMPLANT
COVER SURGICAL LIGHT HANDLE (MISCELLANEOUS) ×3 IMPLANT
COVER WAND RF STERILE (DRAPES) ×2 IMPLANT
CUP ACETBLR 48 OD SECTOR II (Hips) ×2 IMPLANT
DECANTER SPIKE VIAL GLASS SM (MISCELLANEOUS) ×3 IMPLANT
DRAPE STERI IOBAN 125X83 (DRAPES) ×3 IMPLANT
DRAPE U-SHAPE 47X51 STRL (DRAPES) ×6 IMPLANT
DRSG ADAPTIC 3X8 NADH LF (GAUZE/BANDAGES/DRESSINGS) ×3 IMPLANT
DRSG MEPILEX BORDER 4X4 (GAUZE/BANDAGES/DRESSINGS) ×3 IMPLANT
DRSG MEPILEX BORDER 4X8 (GAUZE/BANDAGES/DRESSINGS) ×3 IMPLANT
DURAPREP 26ML APPLICATOR (WOUND CARE) ×3 IMPLANT
ELECT REM PT RETURN 15FT ADLT (MISCELLANEOUS) ×3 IMPLANT
EVACUATOR 1/8 PVC DRAIN (DRAIN) ×3 IMPLANT
GLOVE BIO SURGEON STRL SZ7 (GLOVE) ×3 IMPLANT
GLOVE BIO SURGEON STRL SZ8 (GLOVE) ×3 IMPLANT
GLOVE BIOGEL PI IND STRL 6.5 (GLOVE) IMPLANT
GLOVE BIOGEL PI IND STRL 7.0 (GLOVE) ×1 IMPLANT
GLOVE BIOGEL PI IND STRL 7.5 (GLOVE) IMPLANT
GLOVE BIOGEL PI IND STRL 8 (GLOVE) ×1 IMPLANT
GLOVE BIOGEL PI INDICATOR 6.5 (GLOVE) ×2
GLOVE BIOGEL PI INDICATOR 7.0 (GLOVE) ×6
GLOVE BIOGEL PI INDICATOR 7.5 (GLOVE) ×8
GLOVE BIOGEL PI INDICATOR 8 (GLOVE) ×2
GLOVE SURG SS PI 6.0 STRL IVOR (GLOVE) ×2 IMPLANT
GOWN STRL REUS W/TWL LRG LVL3 (GOWN DISPOSABLE) ×7 IMPLANT
GOWN STRL REUS W/TWL XL LVL3 (GOWN DISPOSABLE) ×5 IMPLANT
HEAD CERAMIC DELTA 28M 12/14P5 (Head) ×2 IMPLANT
HOLDER FOLEY CATH W/STRAP (MISCELLANEOUS) ×3 IMPLANT
LINER MARATHON 28 48 (Hips) ×2 IMPLANT
MANIFOLD NEPTUNE II (INSTRUMENTS) ×3 IMPLANT
PACK ANTERIOR HIP CUSTOM (KITS) ×1 IMPLANT
STEM FEMORAL SZ5 HIGH ACTIS (Nail) ×2 IMPLANT
STRIP CLOSURE SKIN 1/2X4 (GAUZE/BANDAGES/DRESSINGS) ×2 IMPLANT
SUT ETHIBOND NAB CT1 #1 30IN (SUTURE) ×3 IMPLANT
SUT MNCRL AB 4-0 PS2 18 (SUTURE) ×3 IMPLANT
SUT STRATAFIX 0 PDS 27 VIOLET (SUTURE) ×3
SUT VIC AB 2-0 CT1 27 (SUTURE) ×6
SUT VIC AB 2-0 CT1 TAPERPNT 27 (SUTURE) ×2 IMPLANT
SUTURE STRATFX 0 PDS 27 VIOLET (SUTURE) ×1 IMPLANT
SYR 50ML LL SCALE MARK (SYRINGE) IMPLANT
TRAY FOLEY CATH 14FRSI W/METER (CATHETERS) ×2 IMPLANT
TRAY FOLEY MTR SLVR 16FR STAT (SET/KITS/TRAYS/PACK) ×1 IMPLANT
YANKAUER SUCT BULB TIP 10FT TU (MISCELLANEOUS) ×3 IMPLANT

## 2017-10-09 NOTE — Interval H&P Note (Signed)
History and Physical Interval Note:  10/09/2017 7:47 AM  Tamara Stokes  has presented today for surgery, with the diagnosis of right hip osteoarthritis  The various methods of treatment have been discussed with the patient and family. After consideration of risks, benefits and other options for treatment, the patient has consented to  Procedure(s): RIGHT TOTAL HIP ARTHROPLASTY ANTERIOR APPROACH (Right) as a surgical intervention .  The patient's history has been reviewed, patient examined, no change in status, stable for surgery.  I have reviewed the patient's chart and labs.  Questions were answered to the patient's satisfaction.     Pilar Plate Yared Susan

## 2017-10-09 NOTE — Op Note (Signed)
OPERATIVE REPORT- TOTAL HIP ARTHROPLASTY   PREOPERATIVE DIAGNOSIS: Osteoarthritis of the Right hip.   POSTOPERATIVE DIAGNOSIS: Osteoarthritis of the Right  hip.   PROCEDURE: Right total hip arthroplasty, anterior approach.   SURGEON: Gaynelle Arabian, MD   ASSISTANT: Theresa Duty, PA-C  ANESTHESIA:  Spinal  ESTIMATED BLOOD LOSS:-550 mL    DRAINS: Hemovac x1.   COMPLICATIONS: None   CONDITION: PACU - hemodynamically stable.   BRIEF CLINICAL NOTE: Tamara Stokes is a 62 y.o. female who has advanced end-  stage arthritis of their Right  hip with progressively worsening pain and  dysfunction.The patient has failed nonoperative management and presents for  total hip arthroplasty.   PROCEDURE IN DETAIL: After successful administration of spinal  anesthetic, the traction boots for the [facility] bed were placed on both  feet and the patient was placed onto the [facility] bed, boots placed into the leg  holders. The Right hip was then isolated from the perineum with plastic  drapes and prepped and draped in the usual sterile fashion. ASIS and  greater trochanter were marked and a oblique incision was made, starting  at about 1 cm lateral and 2 cm distal to the ASIS and coursing towards  the anterior cortex of the femur. The skin was cut with a 10 blade  through subcutaneous tissue to the level of the fascia overlying the  tensor fascia lata muscle. The fascia was then incised in line with the  incision at the junction of the anterior third and posterior 2/3rd. The  muscle was teased off the fascia and then the interval between the TFL  and the rectus was developed. The Hohmann retractor was then placed at  the top of the femoral neck over the capsule. The vessels overlying the  capsule were cauterized and the fat on top of the capsule was removed.  A Hohmann retractor was then placed anterior underneath the rectus  femoris to give exposure to the entire anterior capsule. A  T-shaped  capsulotomy was performed. The edges were tagged and the femoral head  was identified.       Osteophytes are removed off the superior acetabulum.  The femoral neck was then cut in situ with an oscillating saw. Traction  was then applied to the left lower extremity utilizing the [facility]  traction. The femoral head was then removed. Retractors were placed  around the acetabulum and then circumferential removal of the labrum was  performed. Osteophytes were also removed. Reaming starts at 45 mm to  medialize and  Increased in 2 mm increments to 47 mm. We reamed in  approximately 40 degrees of abduction, 20 degrees anteversion. A 48 mm  pinnacle acetabular shell was then impacted in anatomic position under  fluoroscopic guidance with excellent purchase. We did not need to place  any additional dome screws. A 28 mm neutral + 4 marathon liner was then  placed into the acetabular shell.       The femoral lift was then placed along the lateral aspect of the femur  just distal to the vastus ridge. The leg was  externally rotated and capsule  was stripped off the inferior aspect of the femoral neck down to the  level of the lesser trochanter, this was done with electrocautery. The femur was lifted after this was performed. The  leg was then placed in an extended and adducted position essentially delivering the femur. We also removed the capsule superiorly and the piriformis from the piriformis  fossa to gain excellent exposure of the  proximal femur. Rongeur was used to remove some cancellous bone to get  into the lateral portion of the proximal femur for placement of the  initial starter reamer. The starter broaches was placed  the starter broach  and was shown to go down the center of the canal. Broaching  with the Actis system was then performed starting at size 0  coursing  Up to size 5. A size 5 had excellent torsional and rotational  and axial stability. The trial high offset neck was then  placed  with a 28 + 5 trial head. The hip was then reduced. We confirmed that  the stem was in the canal both on AP and lateral x-rays. It also has excellent sizing. The hip was reduced with outstanding stability through full extension and full external rotation.. AP pelvis was taken and the leg lengths were measured and found to be equal. Hip was then dislocated again and the femoral head and neck removed. The  femoral broach was removed. Size 5 Actis stem with a high offset  neck was then impacted into the femur following native anteversion. Has  excellent purchase in the canal. Excellent torsional and rotational and  axial stability. It is confirmed to be in the canal on AP and lateral  fluoroscopic views. The 28 + 5 ceramic head was placed and the hip  reduced with outstanding stability. Again AP pelvis was taken and it  confirmed that the leg lengths were equal. The wound was then copiously  irrigated with saline solution and the capsule reattached and repaired  with Ethibond suture. 30 ml of .25% Bupivicaine was  injected into the capsule and into the edge of the tensor fascia lata as well as subcutaneous tissue. The fascia overlying the tensor fascia lata was then closed with a running #1 V-Loc. Subcu was closed with interrupted 2-0 Vicryl and subcuticular running 4-0 Monocryl. Incision was cleaned  and dried. Steri-Strips and a bulky sterile dressing applied. Hemovac  drain was hooked to suction and then the patient was awakened and transported to  recovery in stable condition.        Please note that a surgical assistant was a medical necessity for this procedure to perform it in a safe and expeditious manner. Assistant was necessary to provide appropriate retraction of vital neurovascular structures and to prevent femoral fracture and allow for anatomic placement of the prosthesis.  Gaynelle Arabian, M.D.

## 2017-10-09 NOTE — Discharge Instructions (Signed)
°Dr. Frank Aluisio °Total Joint Specialist °[facility] °[address] °[place], [place] [address] °[phone] ° °ANTERIOR APPROACH TOTAL HIP REPLACEMENT POSTOPERATIVE DIRECTIONS ° ° °Hip Rehabilitation, Guidelines Following Surgery  °The results of a hip operation are greatly improved after range of motion and muscle strengthening exercises. Follow all safety measures which are given to protect your hip. If any of these exercises cause increased pain or swelling in your joint, decrease the amount until you are comfortable again. Then slowly increase the exercises. Call your caregiver if you have problems or questions.  ° °HOME CARE INSTRUCTIONS  °• Remove items at home which could result in a fall. This includes throw rugs or furniture in walking pathways.  °· ICE to the affected hip every three hours for 30 minutes at a time and then as needed for pain and swelling.  Continue to use ice on the hip for pain and swelling from surgery. You may notice swelling that will progress down to the foot and ankle.  This is normal after surgery.  Elevate the leg when you are not up walking on it.   °· Continue to use the breathing machine which will help keep your temperature down.  It is common for your temperature to cycle up and down following surgery, especially at night when you are not up moving around and exerting yourself.  The breathing machine keeps your lungs expanded and your temperature down. ° °DIET °You may resume your previous home diet once your are discharged from the hospital. ° °DRESSING / WOUND CARE / SHOWERING °You may shower 3 days after surgery, but keep the wounds dry during showering.  You may use an occlusive plastic wrap (Press'n Seal for example), NO SOAKING/SUBMERGING IN THE BATHTUB.  If the bandage gets wet, change with a clean dry gauze.  If the incision gets wet, pat the wound dry with a clean towel. °You may start showering once you are discharged home but do not submerge the  incision under water. Just pat the incision dry and apply a dry gauze dressing on daily. °Change the surgical dressing daily and reapply a dry dressing each time. ° °ACTIVITY °Walk with your walker as instructed. °Use walker as long as suggested by your caregivers. °Avoid periods of inactivity such as sitting longer than an hour when not asleep. This helps prevent blood clots.  °You may resume a sexual relationship in one month or when given the OK by your doctor.  °You may return to work once you are cleared by your doctor.  °Do not drive a car for 6 weeks or until released by you surgeon.  °Do not drive while taking narcotics. ° °WEIGHT BEARING °Weight bearing as tolerated with assist device (walker, cane, etc) as directed, use it as long as suggested by your surgeon or therapist, typically at least 4-6 weeks. ° °POSTOPERATIVE CONSTIPATION PROTOCOL °Constipation - defined medically as fewer than three stools per week and severe constipation as less than one stool per week. ° °One of the most common issues patients have following surgery is constipation.  Even if you have a regular bowel pattern at home, your normal regimen is likely to be disrupted due to multiple reasons following surgery.  Combination of anesthesia, postoperative narcotics, change in appetite and fluid intake all can affect your bowels.  In order to avoid complications following surgery, here are some recommendations in order to help you during your recovery period. ° °Colace (docusate) - Pick up an over-the-counter form   of Colace or another stool softener and take twice a day as long as you are requiring postoperative pain medications.  Take with a full glass of water daily.  If you experience loose stools or diarrhea, hold the colace until you stool forms back up.  If your symptoms do not get better within 1 week or if they get worse, check with your doctor. ° °Dulcolax (bisacodyl) - Pick up over-the-counter and take as directed by the product  packaging as needed to assist with the movement of your bowels.  Take with a full glass of water.  Use this product as needed if not relieved by Colace only.  ° °MiraLax (polyethylene glycol) - Pick up over-the-counter to have on hand.  MiraLax is a solution that will increase the amount of water in your bowels to assist with bowel movements.  Take as directed and can mix with a glass of water, juice, soda, coffee, or tea.  Take if you go more than two days without a movement. °Do not use MiraLax more than once per day. Call your doctor if you are still constipated or irregular after using this medication for 7 days in a row. ° °If you continue to have problems with postoperative constipation, please contact the office for further assistance and recommendations.  If you experience "the worst abdominal pain ever" or develop nausea or vomiting, please contact the office immediatly for further recommendations for treatment. ° °ITCHING ° If you experience itching with your medications, try taking only a single pain pill, or even half a pain pill at a time.  You can also use Benadryl over the counter for itching or also to help with sleep.  ° °TED HOSE STOCKINGS °Wear the elastic stockings on both legs for three weeks following surgery during the day but you may remove then at night for sleeping. ° °MEDICATIONS °See your medication summary on the “After Visit Summary” that the nursing staff will review with you prior to discharge.  You may have some home medications which will be placed on hold until you complete the course of blood thinner medication.  It is important for you to complete the blood thinner medication as prescribed by your surgeon.  Continue your approved medications as instructed at time of discharge. ° °PRECAUTIONS °If you experience chest pain or shortness of breath - call 911 immediately for transfer to the hospital emergency department.  °If you develop a fever greater that 101 F, purulent drainage  from wound, increased redness or drainage from wound, foul odor from the wound/dressing, or calf pain - CONTACT YOUR SURGEON.   °                                                °FOLLOW-UP APPOINTMENTS °Make sure you keep all of your appointments after your operation with your surgeon and caregivers. You should call the office at the above phone number and make an appointment for approximately two weeks after the date of your surgery or on the date instructed by your surgeon outlined in the "After Visit Summary". ° °RANGE OF MOTION AND STRENGTHENING EXERCISES  °These exercises are designed to help you keep full movement of your hip joint. Follow your caregiver's or physical therapist's instructions. Perform all exercises about fifteen times, three times per day or as directed. Exercise both hips, even if you have   had only one joint replacement. These exercises can be done on a training (exercise) mat, on the floor, on a table or on a bed. Use whatever works the best and is most comfortable for you. Use music or television while you are exercising so that the exercises are a pleasant break in your day. This will make your life better with the exercises acting as a break in routine you can look forward to.  °• Lying on your back, slowly slide your foot toward your buttocks, raising your knee up off the floor. Then slowly slide your foot back down until your leg is straight again.  °• Lying on your back spread your legs as far apart as you can without causing discomfort.  °• Lying on your side, raise your upper leg and foot straight up from the floor as far as is comfortable. Slowly lower the leg and repeat.  °• Lying on your back, tighten up the muscle in the front of your thigh (quadriceps muscles). You can do this by keeping your leg straight and trying to raise your heel off the floor. This helps strengthen the largest muscle supporting your knee.  °• Lying on your back, tighten up the muscles of your buttocks both  with the legs straight and with the knee bent at a comfortable angle while keeping your heel on the floor.  ° °IF YOU ARE TRANSFERRED TO A SKILLED REHAB FACILITY °If the patient is transferred to a skilled rehab facility following release from the hospital, a list of the current medications will be sent to the facility for the patient to continue.  When discharged from the skilled rehab facility, please have the facility set up the patient's Home Health Physical Therapy prior to being released. Also, the skilled facility will be responsible for providing the patient with their medications at time of release from the facility to include their pain medication, the muscle relaxants, and their blood thinner medication. If the patient is still at the rehab facility at time of the two week follow up appointment, the skilled rehab facility will also need to assist the patient in arranging follow up appointment in our office and any transportation needs. ° °MAKE SURE YOU:  °• Understand these instructions.  °• Get help right away if you are not doing well or get worse.  ° ° °Pick up stool softner and laxative for home use following surgery while on pain medications. °Do not submerge incision under water. °Please use good hand washing techniques while changing dressing each day. °May shower starting three days after surgery. °Please use a clean towel to pat the incision dry following showers. °Continue to use ice for pain and swelling after surgery. °Do not use any lotions or creams on the incision until instructed by your surgeon. ° °

## 2017-10-09 NOTE — Transfer of Care (Signed)
Immediate Anesthesia Transfer of Care Note  Patient: Tamara Stokes  Procedure(s) Performed: RIGHT TOTAL HIP ARTHROPLASTY ANTERIOR APPROACH (Right Hip)  Patient Location: PACU  Anesthesia Type:Spinal  Level of Consciousness: awake and alert   Airway & Oxygen Therapy: Patient Spontanous Breathing and Patient connected to face mask oxygen  Post-op Assessment: Report given to RN and Post -op Vital signs reviewed and stable  Post vital signs: Reviewed and stable  Last Vitals:  Vitals Value Taken Time  BP 98/71 10/09/2017 10:20 AM  Temp    Pulse 81 10/09/2017 10:21 AM  Resp 18 10/09/2017 10:21 AM  SpO2 100 % 10/09/2017 10:21 AM  Vitals shown include unvalidated device data.  Last Pain:  Vitals:   10/09/17 0648  TempSrc:   PainSc: 3       Patients Stated Pain Goal: 4 (67/61/95 0932)  Complications: No apparent anesthesia complications

## 2017-10-09 NOTE — Evaluation (Signed)
Physical Therapy Evaluation Patient Details Name: Tamara Stokes MRN: [id number] DOB: 1955/02/11 Today's Date: 10/09/2017   History of Present Illness  62 YO female s/p R DA-THA on 10/09/17. PMH includes DMII, HTN, R knee arthroscopy, carpal tunnel release.  Clinical Impression  Pt presents with R hip pain increasingly so with ambulation, difficulty performing mobility tasks, and decreased tolerance for ambulation limited by pain. Pt to benefit from acute PT to address deficits. Pt ambulated 30 ft in hallway today with RW, requiring min guard assist. PT to progress mobility as able, and will continue to follow acutely.     Follow Up Recommendations Follow surgeon's recommendation for DC plan and follow-up therapies;Supervision for mobility/OOB(HHPT)    Equipment Recommendations  None recommended by PT    Recommendations for Other Services       Precautions / Restrictions Precautions Precautions: Fall Restrictions Weight Bearing Restrictions: No RLE Weight Bearing: Weight bearing as tolerated      Mobility  Bed Mobility Overal bed mobility: Needs Assistance Bed Mobility: Supine to Sit     Supine to sit: Min assist;HOB elevated     General bed mobility comments: Min assist for RLE management. Verbal cuing for sequencing to EOB, increased time and effort.   Transfers Overall transfer level: Needs assistance Equipment used: Rolling walker (2 wheeled) Transfers: Sit to/from Stand Sit to Stand: Min guard;From elevated surface         General transfer comment: Min guard for safety. Verbal cuing for hand placement. Pt with self-steadying upon standing.   Ambulation/Gait Ambulation/Gait assistance: Min guard;+2 safety/equipment(chair follow ) Gait Distance (Feet): 30 Feet Assistive device: Rolling walker (2 wheeled) Gait Pattern/deviations: Step-to pattern;Decreased stride length;Step-through pattern;Decreased weight shift to right;Decreased stance time -  right;Antalgic;Trunk flexed Gait velocity: decr    General Gait Details: Min guard for safety. Verbal cuing for sequencing, placement in RW. Pt adopting step-through gait after 15 ft ambulation. Pt with increased pain with ambulation, relief with sitting.   Stairs            Wheelchair Mobility    Modified Rankin (Stroke Patients Only)       Balance Overall balance assessment: Mild deficits observed, not formally tested                                           Pertinent Vitals/Pain Pain Assessment: 0-10 Pain Score: 3  Pain Location: R hip  Pain Descriptors / Indicators: Sore;Discomfort Pain Intervention(s): Limited activity within patient's tolerance;Repositioned;Ice applied;Monitored during session;RN gave pain meds during session    [place] expects to be discharged to:: Private residence Living Arrangements: Alone Available Help at Discharge: Family;Available 24 hours/day(Pt's sister will be staying with pt for the first 3-4 days, pt's sister does not work so will be available 24/7.) Type of Home: House Home Access: Stairs to enter Entrance Stairs-Rails: Chemical engineer of Steps: [place]: One level Home Equipment: Environmental consultant - 2 wheels;Walker - 4 wheels;Cane - single point;Crutches      Prior Function Level of Independence: Independent with assistive device(s)         Comments: utilized RW and cane for mobility prior to admission. Pt reports she drove prior to surgery.      Hand Dominance   Dominant Hand: Right    Extremity/Trunk Assessment   Upper Extremity Assessment Upper Extremity Assessment: Overall WFL for tasks assessed  Lower Extremity Assessment Lower Extremity Assessment: Overall WFL for tasks assessed;RLE deficits/detail RLE Deficits / Details: Suspected post-surgical R hip weakness; able to perform quad set x5, ankle pumps.  RLE Sensation: WNL    Cervical / Trunk  Assessment Cervical / Trunk Assessment: Normal  Communication   Communication: No difficulties  Cognition Arousal/Alertness: Awake/alert Behavior During Therapy: WFL for tasks assessed/performed Overall Cognitive Status: Within Functional Limits for tasks assessed                                        General Comments      Exercises Total Joint Exercises Ankle Circles/Pumps: AROM;Both;5 reps;Seated Quad Sets: AROM;Right;5 reps;Seated Heel Slides: AROM;Right;Supine(1 )   Assessment/Plan    PT Assessment Patient needs continued PT services  PT Problem List Decreased strength;Pain;Decreased range of motion;Decreased activity tolerance;Decreased knowledge of use of DME;Decreased balance;Decreased mobility       PT Treatment Interventions DME instruction;Therapeutic activities;Gait training;Therapeutic exercise;Patient/family education;Stair training;Balance training;Functional mobility training    PT Goals (Current goals can be found in the Care Plan section)  Acute Rehab PT Goals PT Goal Formulation: With patient Time For Goal Achievement: 10/23/17 Potential to Achieve Goals: Good    Frequency 7X/week   Barriers to discharge        Co-evaluation               AM-PAC PT "6 Clicks" Daily Activity  Outcome Measure Difficulty turning over in bed (including adjusting bedclothes, sheets and blankets)?: Unable Difficulty moving from lying on back to sitting on the side of the bed? : Unable Difficulty sitting down on and standing up from a chair with arms (e.g., wheelchair, bedside commode, etc,.)?: Unable Help needed moving to and from a bed to chair (including a wheelchair)?: None Help needed walking in hospital room?: A Little Help needed climbing 3-5 steps with a railing? : A Little 6 Click Score: 13    End of Session Equipment Utilized During Treatment: Gait belt Activity Tolerance: Patient tolerated treatment well;Patient limited by  pain Patient left: in chair;with chair alarm set;with call bell/phone within reach;with SCD's reapplied Nurse Communication: Mobility status PT Visit Diagnosis: Difficulty in walking, not elsewhere classified (R26.2);Other abnormalities of gait and mobility (R26.89)    Time: 1530-1600 PT Time Calculation (min) (ACUTE ONLY): 30 min   Charges:   PT Evaluation $PT Eval Low Complexity: 1 Low PT Treatments $Gait Training: 8-22 mins        Julien Girt, PT Acute Rehabilitation Services Pager [phone]  Office [phone]   Kaire Stary D Shedric Fredericks 10/09/2017, 4:22 PM

## 2017-10-09 NOTE — Anesthesia Procedure Notes (Signed)
Spinal  Patient location during procedure: OR End time: 10/09/2017 8:28 AM Staffing Resident/CRNA: Cynda Familia, CRNA Performed: resident/CRNA  Preanesthetic Checklist Completed: patient identified, site marked, surgical consent, pre-op evaluation, timeout performed, IV checked, risks and benefits discussed and monitors and equipment checked Spinal Block Patient position: sitting Prep: site prepped and draped and DuraPrep Patient monitoring: heart rate, continuous pulse ox, blood pressure and cardiac monitor Approach: midline Location: L2-3 Injection technique: single-shot Needle Needle type: Sprotte  Needle gauge: 24 G Additional Notes Expiration date of tray noted and within date.   Patient tolerated procedure well. Gifford Shave present and supervised procedure-- prep dry at time of insertion

## 2017-10-09 NOTE — Anesthesia Procedure Notes (Signed)
Procedure Name: MAC Date/Time: 10/09/2017 7:20 AM Performed by: Cynda Familia, CRNA Pre-anesthesia Checklist: Patient identified, Emergency Drugs available, Suction available, Patient being monitored and Timeout performed Patient Re-evaluated:Patient Re-evaluated prior to induction Oxygen Delivery Method: Simple face mask Placement Confirmation: positive ETCO2 and breath sounds checked- equal and bilateral Dental Injury: Teeth and Oropharynx as per pre-operative assessment  Comments: Sedation for spinal

## 2017-10-09 NOTE — Anesthesia Postprocedure Evaluation (Signed)
Anesthesia Post Note  Patient: Tamara Stokes  Procedure(s) Performed: RIGHT TOTAL HIP ARTHROPLASTY ANTERIOR APPROACH (Right Hip)     Patient location during evaluation: PACU Anesthesia Type: Spinal Level of consciousness: oriented, awake and alert and awake Pain management: pain level controlled Vital Signs Assessment: post-procedure vital signs reviewed and stable Respiratory status: spontaneous breathing, respiratory function stable and nonlabored ventilation Cardiovascular status: blood pressure returned to baseline and stable Postop Assessment: no headache, no backache, no apparent nausea or vomiting, spinal receding and patient able to bend at knees Anesthetic complications: no    Last Vitals:  Vitals:   10/09/17 1235 10/09/17 1410  BP: 109/78 117/77  Pulse: 82 98  Resp: 13 17  Temp: (!) 36.4 C (!) 36.3 C  SpO2: 99% 96%    Last Pain:  Vitals:   10/09/17 1410  TempSrc: Oral  PainSc:                  Catalina Gravel

## 2017-10-10 ENCOUNTER — Encounter (HOSPITAL_COMMUNITY): Payer: Self-pay | Admitting: Orthopedic Surgery

## 2017-10-10 LAB — BASIC METABOLIC PANEL
Anion gap: 5 (ref 5–15)
BUN: 10 mg/dL (ref 8–23)
CALCIUM: 8.9 mg/dL (ref 8.9–10.3)
CHLORIDE: 110 mmol/L (ref 98–111)
CO2: 27 mmol/L (ref 22–32)
Creatinine, Ser: 0.52 mg/dL (ref 0.44–1.00)
GFR calc non Af Amer: >60 mL/min (ref >60)
GLUCOSE: 202 mg/dL — AB (ref 70–99)
Potassium: 4.1 mmol/L (ref 3.5–5.1)
Sodium: 142 mmol/L (ref 135–145)

## 2017-10-10 LAB — CBC
HEMATOCRIT: 29.5 % — AB (ref 36.0–46.0)
Hemoglobin: 9.5 g/dL — ABNORMAL LOW (ref 12.0–15.0)
MCH: 29.2 pg (ref 26.0–34.0)
MCHC: 32.2 g/dL (ref 30.0–36.0)
MCV: 90.8 fL (ref 80.0–100.0)
Platelets: 281 10*3/uL (ref 150–400)
RBC: 3.25 MIL/uL — ABNORMAL LOW (ref 3.87–5.11)
RDW: 14.6 % (ref 11.5–15.5)
WBC: 14.3 10*3/uL — AB (ref 4.0–10.5)
nRBC: 0 % (ref 0.0–0.2)

## 2017-10-10 LAB — GLUCOSE, CAPILLARY
Glucose-Capillary: 124 mg/dL — ABNORMAL HIGH (ref 70–99)
Glucose-Capillary: 128 mg/dL — ABNORMAL HIGH (ref 70–99)
Glucose-Capillary: 224 mg/dL — ABNORMAL HIGH (ref 70–99)
Glucose-Capillary: 189 mg/dL — ABNORMAL HIGH (ref 70–99)

## 2017-10-10 NOTE — Progress Notes (Signed)
Physical Therapy Treatment Patient Details Name: Tamara Stokes MRN: [id number] DOB: 07/27/1955 Today's Date: 10/10/2017    History of Present Illness 62 YO female s/p R DA-THA on 10/09/17. PMH includes DMII, HTN, R knee arthroscopy, carpal tunnel release.    PT Comments    POD # 1 pm session Assisted OOB to amb in hallway then assisted back to bed. Pt plans to D/C to home tomorrow.    Follow Up Recommendations  Follow surgeon's recommendation for DC plan and follow-up therapies;Supervision for mobility/OOB     Equipment Recommendations  None recommended by PT    Recommendations for Other Services       Precautions / Restrictions Precautions Precautions: Fall Restrictions Weight Bearing Restrictions: No RLE Weight Bearing: Weight bearing as tolerated    Mobility  Bed Mobility Overal bed mobility: Needs Assistance Bed Mobility: Supine to Sit           General bed mobility comments: assisted back to bed   Transfers Overall transfer level: Needs assistance Equipment used: Rolling walker (2 wheeled) Transfers: Sit to/from [employer] Transfers Sit to Stand: Min guard;From elevated surface         General transfer comment: Min guard for safety. Verbal cuing for hand placement. Pt with self-steadying upon standing.   Ambulation/Gait Ambulation/Gait assistance: Supervision;Min guard Gait Distance (Feet): 37 Feet Assistive device: Rolling walker (2 wheeled) Gait Pattern/deviations: Step-to pattern;Decreased stride length;Step-through pattern;Decreased weight shift to right;Decreased stance time - right;Antalgic;Trunk flexed Gait velocity: decreased   General Gait Details: increased time with short strides.     Stairs             Wheelchair Mobility    Modified Rankin (Stroke Patients Only)       Balance                                            Cognition Arousal/Alertness: Awake/alert Behavior During Therapy:  WFL for tasks assessed/performed Overall Cognitive Status: Within Functional Limits for tasks assessed                                        Exercises      General Comments        Pertinent Vitals/Pain Pain Score: 5  Pain Location: R hip  Pain Descriptors / Indicators: Sore;Discomfort;Tender Pain Intervention(s): Monitored during session;Repositioned;Ice applied;Premedicated before session    Home Living                      Prior Function            PT Goals (current goals can now be found in the care plan section) Progress towards PT goals: Progressing toward goals    Frequency    7X/week      PT Plan Current plan remains appropriate    Co-evaluation              AM-PAC PT "6 Clicks" Daily Activity  Outcome Measure  Difficulty turning over in bed (including adjusting bedclothes, sheets and blankets)?: A Lot Difficulty moving from lying on back to sitting on the side of the bed? : A Lot Difficulty sitting down on and standing up from a chair with arms (e.g., wheelchair, bedside commode, etc,.)?: A Lot Help needed moving to and  from a bed to chair (including a wheelchair)?: A Lot Help needed walking in hospital room?: A Lot Help needed climbing 3-5 steps with a railing? : Total 6 Click Score: 11    End of Session Equipment Utilized During Treatment: Gait belt Activity Tolerance: Patient tolerated treatment well;Patient limited by pain Patient left: in chair;with chair alarm set;with call bell/phone within reach;with SCD's reapplied Nurse Communication: Mobility status PT Visit Diagnosis: Difficulty in walking, not elsewhere classified (R26.2);Other abnormalities of gait and mobility (R26.89)     Time: 1443-1510 PT Time Calculation (min) (ACUTE ONLY): 27 min  Charges:  $Gait Training: 8-22 mins $Therapeutic Activity: 8-22 mins                     Rica Koyanagi  PTA Acute  Rehabilitation Services Pager       [phone] Office      [phone]

## 2017-10-10 NOTE — Progress Notes (Signed)
   Subjective: 1 Day Post-Op Procedure(s) (LRB): RIGHT TOTAL HIP ARTHROPLASTY ANTERIOR APPROACH (Right) Patient reports pain as moderate.   Patient seen in rounds with Dr. Wynelle Link. Patient is well, and has had no acute complaints or problems other than pain in the right hip. Foley catheter removed this AM. No issues overnight. No CP, SHOB.  We will continue therapy today.   Objective: Vital signs in last 24 hours: Temp:  [97.4 F (36.3 C)-98.4 F (36.9 C)] 98.3 F (36.8 C) (10/10 0514) Pulse Rate:  [75-102] 92 (10/10 0514) Resp:  [10-18] 18 (10/09 1520) BP: (98-146)/(68-87) 140/85 (10/10 0514) SpO2:  [96 %-100 %] 100 % (10/10 0514)  Intake/Output from previous day:  Intake/Output Summary (Last 24 hours) at 10/10/2017 0758 Last data filed at 10/10/2017 0600 Gross per 24 hour  Intake 4789.03 ml  Output 3180 ml  Net 1609.03 ml    Labs: Recent Labs    10/10/17 0520  HGB 9.5*   Recent Labs    10/10/17 0520  WBC 14.3*  RBC 3.25*  HCT 29.5*  PLT 281   Recent Labs    10/10/17 0520  NA 142  K 4.1  CL 110  CO2 27  BUN 10  CREATININE 0.52  GLUCOSE 202*  CALCIUM 8.9   Exam: General - Patient is Alert and Oriented Extremity - Neurologically intact Neurovascular intact Sensation intact distally Dorsiflexion/Plantar flexion intact Dressing - dressing C/D/I Motor Function - intact, moving foot and toes well on exam.   Past Medical History:  Diagnosis Date  . Asthma    prn inhaler  . Bilateral dry eyes   . Bilateral hip pain   . DDD (degenerative disc disease), lumbar   . History of PID 2007   tubo-ovarian abscess  . Hypertension    followed by pcp  . Leg weakness, bilateral    due to OA bilateral hips  . Mixed hyperlipidemia   . OA (osteoarthritis)    knees  . OA (osteoarthritis) of hip    RIGHT  . OSA on CPAP    per study in epic 11/ 2005 very severe osa (per pt uses every night)  . Osteopenia   . Type 2 diabetes mellitus ([place])    followed by pcp   . Wears glasses     Assessment/Plan: 1 Day Post-Op Procedure(s) (LRB): RIGHT TOTAL HIP ARTHROPLASTY ANTERIOR APPROACH (Right) Principal Problem:   OA (osteoarthritis) of hip  Estimated body mass index is 31.91 kg/m as calculated from the following:   Height as of this encounter: 5' 3.5" (1.613 m).   Weight as of this encounter: 83 kg. Advance diet Up with therapy  DVT Prophylaxis - Aspirin Weight bearing as tolerated. D/C O2 and pulse ox and try on room air. Hemovac pulled without difficulty, will continue therapy.  Plan is to go Home after hospital stay. Possible discharge tomorrow if progressing with therapy and meeting goals.   Theresa Duty, PA-C Orthopedic Surgery 10/10/2017, 7:58 AM

## 2017-10-10 NOTE — Care Management Note (Signed)
Case Management Note  Patient Details  Name: Tamara Stokes MRN: [id number] Date of Birth: 1956-01-01  Subjective/Objective:    Discharge planning, spoke with patient and spouse at bedside. Have chosen [facility] for [facility] PT, evaluate and treat.  Action/Plan: Contacted [facility] for referral. Has RW and 3n. [phone]                Expected Discharge Date:  10/11/17               Expected Discharge Plan:  [place]  In-House Referral:  NA  Discharge planning Services  CM Consult  Post Acute Care Choice:  Home Health Choice offered to:  Patient  DME Arranged:  N/A DME Agency:  NA  HH Arranged:  PT [place] Agency:  [facility] (formerly [employer])  Status of Service:  Completed, signed off  If discussed at [employer], dates discussed:    Additional Comments:  Guadalupe Maple, RN 10/10/2017, 11:54 AM

## 2017-10-10 NOTE — Progress Notes (Signed)
Physical Therapy Treatment Patient Details Name: Tamara Stokes MRN: [id number] DOB: January 11, 1955 Today's Date: 10/10/2017    History of Present Illness 62 YO female s/p R DA-THA on 10/09/17. PMH includes DMII, HTN, R knee arthroscopy, carpal tunnel release.    PT Comments    POD # 1 am session Assisted OOB to amb in hallway then performed some THR TE's followed by ICE.  Not yet ready to practice stairs.    Follow Up Recommendations  Follow surgeon's recommendation for DC plan and follow-up therapies;Supervision for mobility/OOB     Equipment Recommendations  None recommended by PT    Recommendations for Other Services       Precautions / Restrictions Precautions Precautions: Fall Restrictions Weight Bearing Restrictions: No RLE Weight Bearing: Weight bearing as tolerated    Mobility  Bed Mobility Overal bed mobility: Needs Assistance Bed Mobility: Supine to Sit           General bed mobility comments: demonstrated and instructed how to use her gait belt to self assist R LE off bed  Transfers Overall transfer level: Needs assistance Equipment used: Rolling walker (2 wheeled) Transfers: Sit to/from [employer] Transfers Sit to Stand: Min guard;From elevated surface         General transfer comment: Min guard for safety. Verbal cuing for hand placement. Pt with self-steadying upon standing.   Ambulation/Gait Ambulation/Gait assistance: Supervision;Min guard Gait Distance (Feet): 27 Feet Assistive device: Rolling walker (2 wheeled) Gait Pattern/deviations: Step-to pattern;Decreased stride length;Step-through pattern;Decreased weight shift to right;Decreased stance time - right;Antalgic;Trunk flexed Gait velocity: decreased   General Gait Details: increased time with short strides.     Stairs             Wheelchair Mobility    Modified Rankin (Stroke Patients Only)       Balance                                             Cognition Arousal/Alertness: Awake/alert Behavior During Therapy: WFL for tasks assessed/performed Overall Cognitive Status: Within Functional Limits for tasks assessed                                        Exercises      General Comments        Pertinent Vitals/Pain Pain Score: 5  Pain Location: R hip  Pain Descriptors / Indicators: Sore;Discomfort;Tender Pain Intervention(s): Monitored during session;Repositioned;Ice applied;Premedicated before session    Home Living                      Prior Function            PT Goals (current goals can now be found in the care plan section) Progress towards PT goals: Progressing toward goals    Frequency    7X/week      PT Plan Current plan remains appropriate    Co-evaluation              AM-PAC PT "6 Clicks" Daily Activity  Outcome Measure  Difficulty turning over in bed (including adjusting bedclothes, sheets and blankets)?: A Lot Difficulty moving from lying on back to sitting on the side of the bed? : A Lot Difficulty sitting down on and standing up from a chair  with arms (e.g., wheelchair, bedside commode, etc,.)?: A Lot Help needed moving to and from a bed to chair (including a wheelchair)?: A Lot Help needed walking in hospital room?: A Lot Help needed climbing 3-5 steps with a railing? : Total 6 Click Score: 11    End of Session Equipment Utilized During Treatment: Gait belt Activity Tolerance: Patient tolerated treatment well;Patient limited by pain Patient left: in chair;with chair alarm set;with call bell/phone within reach;with SCD's reapplied Nurse Communication: Mobility status PT Visit Diagnosis: Difficulty in walking, not elsewhere classified (R26.2);Other abnormalities of gait and mobility (R26.89)     Time: 1749-4496 PT Time Calculation (min) (ACUTE ONLY): 25 min  Charges:  $Gait Training: 8-22 mins $Therapeutic Exercise: 8-22 mins                      Rica Koyanagi  PTA Acute  Rehabilitation Services Pager      [phone] Office      [phone]

## 2017-10-11 LAB — BASIC METABOLIC PANEL
ANION GAP: 8 (ref 5–15)
BUN: 12 mg/dL (ref 8–23)
CHLORIDE: 107 mmol/L (ref 98–111)
CO2: 29 mmol/L (ref 22–32)
CREATININE: 0.57 mg/dL (ref 0.44–1.00)
Calcium: 9.8 mg/dL (ref 8.9–10.3)
GFR calc non Af Amer: >60 mL/min (ref >60)
Glucose, Bld: 111 mg/dL — ABNORMAL HIGH (ref 70–99)
Potassium: 4.3 mmol/L (ref 3.5–5.1)
SODIUM: 144 mmol/L (ref 135–145)

## 2017-10-11 LAB — CBC
HCT: 28.7 % — ABNORMAL LOW (ref 36.0–46.0)
HEMOGLOBIN: 9.3 g/dL — AB (ref 12.0–15.0)
MCH: 29.8 pg (ref 26.0–34.0)
MCHC: 32.4 g/dL (ref 30.0–36.0)
MCV: 92 fL (ref 80.0–100.0)
NRBC: 0 % (ref 0.0–0.2)
Platelets: 282 10*3/uL (ref 150–400)
RBC: 3.12 MIL/uL — ABNORMAL LOW (ref 3.87–5.11)
RDW: 15.1 % (ref 11.5–15.5)
WBC: 16.5 10*3/uL — AB (ref 4.0–10.5)

## 2017-10-11 LAB — GLUCOSE, CAPILLARY
GLUCOSE-CAPILLARY: 89 mg/dL (ref 70–99)
GLUCOSE-CAPILLARY: 117 mg/dL — AB (ref 70–99)
GLUCOSE-CAPILLARY: 106 mg/dL — AB (ref 70–99)

## 2017-10-11 MED ORDER — ASPIRIN 325 MG PO TBEC: 325.0000 mg | DELAYED_RELEASE_TABLET | Freq: Every day | ORAL | 0 refills | Status: AC

## 2017-10-11 MED ORDER — METHOCARBAMOL 500 MG PO TABS: 500.0000 mg | ORAL_TABLET | Freq: Four times a day (QID) | ORAL | 0 refills | Status: DC | PRN

## 2017-10-11 NOTE — Progress Notes (Signed)
Physical Therapy Treatment Patient Details Name: Tamara Stokes MRN: [id number] DOB: 08-14-1955 Today's Date: 10/11/2017    History of Present Illness 62 YO female s/p R DA-THA on 10/09/17. PMH includes DMII, HTN, R knee arthroscopy, carpal tunnel release.    PT Comments    Pt seen for gait stair training and review of HEP; pt grossly supervision for gait and stairs, feels comfortable with techniques; ready for d/c from mobility standpoint.  This treatment completed at 1026, RN aware; no family present  Follow Up Recommendations  Follow surgeon's recommendation for DC plan and follow-up therapies;Supervision for mobility/OOB     Equipment Recommendations  None recommended by PT    Recommendations for Other Services       Precautions / Restrictions Precautions Precautions: Fall Restrictions Weight Bearing Restrictions: No RLE Weight Bearing: Weight bearing as tolerated    Mobility  Bed Mobility               General bed mobility comments: pt standing in room with NT on arrival  Transfers Overall transfer level: Needs assistance Equipment used: Rolling walker (2 wheeled) Transfers: Sit to/from Stand Sit to Stand: Supervision         General transfer comment: cues for hand placement  Ambulation/Gait Ambulation/Gait assistance: Supervision Gait Distance (Feet): 40 Feet Assistive device: Rolling walker (2 wheeled) Gait Pattern/deviations: Step-to pattern;Decreased step length - right;Decreased step length - left;Decreased weight shift to right Gait velocity: decreased   General Gait Details: incr time, slow but steady gait, cues for sequence initially   Stairs Stairs: Yes Stairs assistance: Min guard Stair Management: One rail Right;Step to pattern;Forwards;With cane Number of Stairs: 4 General stair comments: cues for sequence however pt was using same technique prior to surgery d/t pain   Wheelchair Mobility    Modified Rankin (Stroke Patients  Only)       Balance                                            Cognition Arousal/Alertness: Awake/alert Behavior During Therapy: WFL for tasks assessed/performed Overall Cognitive Status: Within Functional Limits for tasks assessed                                        Exercises Total Joint Exercises Ankle Circles/Pumps: AROM;Both;5 reps Quad Sets: AROM;Both;5 reps Heel Slides: (reviewed, pt has been doing with gait belt to self assist)    General Comments        Pertinent Vitals/Pain Pain Assessment: 0-10 Pain Score: 7  Pain Location: R hip  Pain Descriptors / Indicators: Sore;Discomfort;Tender Pain Intervention(s): Limited activity within patient's tolerance;Monitored during session;Premedicated before session;Repositioned;Ice applied    Home Living                      Prior Function            PT Goals (current goals can now be found in the care plan section) Acute Rehab PT Goals PT Goal Formulation: With patient Time For Goal Achievement: 10/23/17 Potential to Achieve Goals: Good Progress towards PT goals: Progressing toward goals    Frequency    7X/week      PT Plan Current plan remains appropriate    Co-evaluation  AM-PAC PT "6 Clicks" Daily Activity  Outcome Measure  Difficulty turning over in bed (including adjusting bedclothes, sheets and blankets)?: A Lot Difficulty moving from lying on back to sitting on the side of the bed? : A Lot Difficulty sitting down on and standing up from a chair with arms (e.g., wheelchair, bedside commode, etc,.)?: A Little Help needed moving to and from a bed to chair (including a wheelchair)?: A Little Help needed walking in hospital room?: A Little Help needed climbing 3-5 steps with a railing? : A Little 6 Click Score: 16    End of Session Equipment Utilized During Treatment: Gait belt Activity Tolerance: Patient tolerated treatment  well;Patient limited by pain Patient left: in chair;with chair alarm set;with call bell/phone within reach Nurse Communication: Mobility status;Other (comment)(ready d/c) PT Visit Diagnosis: Difficulty in walking, not elsewhere classified (R26.2);Other abnormalities of gait and mobility (R26.89)     Time: 0768-0881 PT Time Calculation (min) (ACUTE ONLY): 44 min  Charges:  $Gait Training: 38-52 mins                     Kenyon Ana, PT  Pager: [phone] Acute Rehab Dept [facility]): [phone]   10/11/2017    [facility] 10/11/2017, 10:46 AM

## 2017-10-11 NOTE — Plan of Care (Signed)
Pt alert and oriented, pain well controlled with PO pain meds. Plans to d/c home per MD order. RN will monitor.

## 2017-10-11 NOTE — Progress Notes (Signed)
   Subjective: 2 Days Post-Op Procedure(s) (LRB): RIGHT TOTAL HIP ARTHROPLASTY ANTERIOR APPROACH (Right) Patient reports pain as mild.   Patient seen in rounds by Dr. Wynelle Link. Patient is well, and has had no acute complaints or problems. States she is ready to go home. Denies chest pain or SOB. Voiding without difficulty and positive flatus.  Plan is to go Home after hospital stay.  Objective: Vital signs in last 24 hours: Temp:  [97.7 F (36.5 C)-98.8 F (37.1 C)] 97.7 F (36.5 C) (10/11 0446) Pulse Rate:  [64-89] 64 (10/11 0446) Resp:  [15-16] 16 (10/11 0446) BP: (120-153)/(69-92) 153/88 (10/11 0446) SpO2:  [96 %-100 %] 98 % (10/11 0446)  Intake/Output from previous day:  Intake/Output Summary (Last 24 hours) at 10/11/2017 0709 Last data filed at 10/11/2017 0600 Gross per 24 hour  Intake 1155 ml  Output 3300 ml  Net -2145 ml    Labs: Recent Labs    10/10/17 0520 10/11/17 0521  HGB 9.5* 9.3*   Recent Labs    10/10/17 0520 10/11/17 0521  WBC 14.3* 16.5*  RBC 3.25* 3.12*  HCT 29.5* 28.7*  PLT 281 282   Recent Labs    10/10/17 0520 10/11/17 0521  NA 142 144  K 4.1 4.3  CL 110 107  CO2 27 29  BUN 10 12  CREATININE 0.52 0.57  GLUCOSE 202* 111*  CALCIUM 8.9 9.8   Exam: General - Patient is Alert and Oriented Extremity - Neurologically intact Neurovascular intact Sensation intact distally Dorsiflexion/Plantar flexion intact Dressing/Incision - clean, dry, no drainage Motor Function - intact, moving foot and toes well on exam.   Past Medical History:  Diagnosis Date  . Asthma    prn inhaler  . Bilateral dry eyes   . Bilateral hip pain   . DDD (degenerative disc disease), lumbar   . History of PID 2007   tubo-ovarian abscess  . Hypertension    followed by pcp  . Leg weakness, bilateral    due to OA bilateral hips  . Mixed hyperlipidemia   . OA (osteoarthritis)    knees  . OA (osteoarthritis) of hip    RIGHT  . OSA on CPAP    per study in  epic 11/ 2005 very severe osa (per pt uses every night)  . Osteopenia   . Type 2 diabetes mellitus ([place])    followed by pcp  . Wears glasses     Assessment/Plan: 2 Days Post-Op Procedure(s) (LRB): RIGHT TOTAL HIP ARTHROPLASTY ANTERIOR APPROACH (Right) Principal Problem:   OA (osteoarthritis) of hip  Estimated body mass index is 31.91 kg/m as calculated from the following:   Height as of this encounter: 5' 3.5" (1.613 m).   Weight as of this encounter: 83 kg. Up with therapy D/C IV fluids  DVT Prophylaxis - Aspirin Weight-bearing as tolerated  Plan for discharge to home today with HHPT. Follow-up in the office in 2 weeks with Dr. Wynelle Link.  Theresa Duty, PA-C Orthopedic Surgery 10/11/2017, 7:09 AM

## 2017-10-14 NOTE — Discharge Summary (Signed)
Physician Discharge Summary   Patient ID: Tamara Stokes MRN: [id number] DOB/AGE: Feb 09, 1955 62 y.o.  Admit date: 10/09/2017 Discharge date: 10/11/2017  Primary Diagnosis: Osteoarthritis, right hip   Admission Diagnoses:  Past Medical History:  Diagnosis Date  . Asthma    prn inhaler  . Bilateral dry eyes   . Bilateral hip pain   . DDD (degenerative disc disease), lumbar   . History of PID 2007   tubo-ovarian abscess  . Hypertension    followed by pcp  . Leg weakness, bilateral    due to OA bilateral hips  . Mixed hyperlipidemia   . OA (osteoarthritis)    knees  . OA (osteoarthritis) of hip    RIGHT  . OSA on CPAP    per study in epic 11/ 2005 very severe osa (per pt uses every night)  . Osteopenia   . Type 2 diabetes mellitus ([place])    followed by pcp  . Wears glasses    Discharge Diagnoses:   Principal Problem:   OA (osteoarthritis) of hip  Estimated body mass index is 31.91 kg/m as calculated from the following:   Height as of this encounter: 5' 3.5" (1.613 m).   Weight as of this encounter: 83 kg.  Procedure:  Procedure(s) (LRB): RIGHT TOTAL HIP ARTHROPLASTY ANTERIOR APPROACH (Right)   Consults: None  HPI: Tamara Stokes is a 62 y.o. female who has advanced end-stage arthritis of their Right  hip with progressively worsening pain and dysfunction.The patient has failed nonoperative management and presents for total hip arthroplasty.   Laboratory Data: Admission on 10/09/2017, Discharged on 10/11/2017  Component Date Value Ref Range Status  . WBC 10/02/2017 8.3  4.0 - 10.5 K/uL Final  . RBC 10/02/2017 4.29  3.87 - 5.11 MIL/uL Final  . Hemoglobin 10/02/2017 12.9  12.0 - 15.0 g/dL Final  . HCT 10/02/2017 38.5  36.0 - 46.0 % Final  . MCV 10/02/2017 89.7  78.0 - 100.0 fL Final  . MCH 10/02/2017 30.1  26.0 - 34.0 pg Final  . MCHC 10/02/2017 33.5  30.0 - 36.0 g/dL Final  . RDW 10/02/2017 15.3  11.5 - 15.5 % Final  . Platelets 10/02/2017 412* 150 -  400 K/uL Final   Performed at [facility], [place] [address], [place], [place] [address]  . Sodium 10/02/2017 145  135 - 145 mmol/L Final  . Potassium 10/02/2017 4.1  3.5 - 5.1 mmol/L Final  . Chloride 10/02/2017 108  98 - 111 mmol/L Final  . CO2 10/02/2017 26  22 - 32 mmol/L Final  . Glucose, Bld 10/02/2017 82  70 - 99 mg/dL Final  . BUN 10/02/2017 11  8 - 23 mg/dL Final  . Creatinine, Ser 10/02/2017 0.65  0.44 - 1.00 mg/dL Final  . Calcium 10/02/2017 10.5* 8.9 - 10.3 mg/dL Final  . Total Protein 10/02/2017 7.7  6.5 - 8.1 g/dL Final  . Albumin 10/02/2017 4.4  3.5 - 5.0 g/dL Final  . AST 10/02/2017 24  15 - 41 U/L Final  . ALT 10/02/2017 15  0 - 44 U/L Final  . Alkaline Phosphatase 10/02/2017 49  38 - 126 U/L Final  . Total Bilirubin 10/02/2017 0.7  0.3 - 1.2 mg/dL Final  . GFR calc non Af Amer 10/02/2017 >60  >60 mL/min Final  . GFR calc Af Amer 10/02/2017 >60  >60 mL/min Final   Comment: (NOTE) The eGFR has been calculated using the CKD EPI equation. This calculation has not been  validated in all clinical situations. eGFR's persistently <60 mL/min signify possible Chronic Kidney Disease.   Georgiann Hahn gap 10/02/2017 11  5 - 15 Final   Performed at [facility], [place] [address] [place], [place] [address]  . Prothrombin Time 10/02/2017 12.0  11.4 - 15.2 seconds Final  . INR 10/02/2017 0.89   Final   Performed at [facility], [place] [address] [place], [place] [address]  . aPTT 10/02/2017 30  24 - 36 seconds Final   Performed at [facility], [place] [address], [place], [place] [address]  . ABO/RH(D) 10/02/2017 O POS   Final  . Antibody Screen 10/02/2017 NEG   Final  . Sample Expiration 10/02/2017 10/12/2017   Final  . Extend sample reason 10/02/2017    Final                   Value:NO TRANSFUSIONS OR PREGNANCY IN THE PAST 3 MONTHS Performed at [facility], [place] [address] [place], [place]  [address]   . MRSA, PCR 10/02/2017 POSITIVE* NEGATIVE Final   Comment: RESULT CALLED TO, READ BACK BY AND VERIFIED WITH: DENISE SEXTON,RN [id number] @ [place]   . Staphylococcus aureus 10/02/2017 POSITIVE* NEGATIVE Final   Comment: RESULT CALLED TO, READ BACK BY AND VERIFIED WITH: DENISE SEXTON,RN [id number] @ 27 BY J SCOTTON (NOTE) The Xpert SA Assay (FDA approved for NASAL specimens in patients 67 years of age and older), is one component of a comprehensive surveillance program. It is not intended to diagnose infection nor to guide or monitor treatment. Performed at [facility], [place] [address] [place], [place] [address]   . Glucose-Capillary 10/09/2017 99  70 - 99 mg/dL Final  . Glucose-Capillary 10/09/2017 87  70 - 99 mg/dL Final  . Glucose-Capillary 10/09/2017 76  70 - 99 mg/dL Final  . Glucose-Capillary 10/09/2017 193* 70 - 99 mg/dL Final  . WBC 10/10/2017 14.3* 4.0 - 10.5 K/uL Final  . RBC 10/10/2017 3.25* 3.87 - 5.11 MIL/uL Final  . Hemoglobin 10/10/2017 9.5* 12.0 - 15.0 g/dL Final  . HCT 10/10/2017 29.5* 36.0 - 46.0 % Final  . MCV 10/10/2017 90.8  80.0 - 100.0 fL Final  . MCH 10/10/2017 29.2  26.0 - 34.0 pg Final  . MCHC 10/10/2017 32.2  30.0 - 36.0 g/dL Final  . RDW 10/10/2017 14.6  11.5 - 15.5 % Final  . Platelets 10/10/2017 281  150 - 400 K/uL Final  . nRBC 10/10/2017 0.0  0.0 - 0.2 % Final   Performed at [facility], [place] [address] [place], [place] [address]  . Sodium 10/10/2017 142  135 - 145 mmol/L Final  . Potassium 10/10/2017 4.1  3.5 - 5.1 mmol/L Final  . Chloride 10/10/2017 110  98 - 111 mmol/L Final  . CO2 10/10/2017 27  22 - 32 mmol/L Final  . Glucose, Bld 10/10/2017 202* 70 - 99 mg/dL Final  . BUN 10/10/2017 10  8 - 23 mg/dL Final  . Creatinine, Ser 10/10/2017 0.52  0.44 - 1.00 mg/dL Final  . Calcium 10/10/2017 8.9  8.9 - 10.3 mg/dL Final  . GFR calc non Af Amer 10/10/2017 >60  >60 mL/min Final  . GFR calc Af Amer  10/10/2017 >60  >60 mL/min Final   Comment: (NOTE) The eGFR has been calculated using the CKD EPI equation. This calculation has not been validated in all clinical situations. eGFR's persistently <60 mL/min signify possible  Chronic Kidney Disease.   Georgiann Hahn gap 10/10/2017 5  5 - 15 Final   Performed at [facility], [place] [address], [place], [place] [address]  . Glucose-Capillary 10/09/2017 201* 70 - 99 mg/dL Final  . Glucose-Capillary 10/10/2017 124* 70 - 99 mg/dL Final  . Glucose-Capillary 10/10/2017 128* 70 - 99 mg/dL Final  . WBC 10/11/2017 16.5* 4.0 - 10.5 K/uL Final  . RBC 10/11/2017 3.12* 3.87 - 5.11 MIL/uL Final  . Hemoglobin 10/11/2017 9.3* 12.0 - 15.0 g/dL Final  . HCT 10/11/2017 28.7* 36.0 - 46.0 % Final  . MCV 10/11/2017 92.0  80.0 - 100.0 fL Final  . MCH 10/11/2017 29.8  26.0 - 34.0 pg Final  . MCHC 10/11/2017 32.4  30.0 - 36.0 g/dL Final  . RDW 10/11/2017 15.1  11.5 - 15.5 % Final  . Platelets 10/11/2017 282  150 - 400 K/uL Final  . nRBC 10/11/2017 0.0  0.0 - 0.2 % Final   Performed at [facility], [place] [address], [place], [place] [address]  . Sodium 10/11/2017 144  135 - 145 mmol/L Final  . Potassium 10/11/2017 4.3  3.5 - 5.1 mmol/L Final  . Chloride 10/11/2017 107  98 - 111 mmol/L Final  . CO2 10/11/2017 29  22 - 32 mmol/L Final  . Glucose, Bld 10/11/2017 111* 70 - 99 mg/dL Final  . BUN 10/11/2017 12  8 - 23 mg/dL Final  . Creatinine, Ser 10/11/2017 0.57  0.44 - 1.00 mg/dL Final  . Calcium 10/11/2017 9.8  8.9 - 10.3 mg/dL Final  . GFR calc non Af Amer 10/11/2017 >60  >60 mL/min Final  . GFR calc Af Amer 10/11/2017 >60  >60 mL/min Final   Comment: (NOTE) The eGFR has been calculated using the CKD EPI equation. This calculation has not been validated in all clinical situations. eGFR's persistently <60 mL/min signify possible Chronic Kidney Disease.   Georgiann Hahn gap 10/11/2017 8  5 - 15 Final   Performed at [facility], [place] [address] [place], [place] [address]  . Glucose-Capillary 10/10/2017 224* 70 - 99 mg/dL Final  . Glucose-Capillary 10/10/2017 189* 70 - 99 mg/dL Final  . Glucose-Capillary 10/11/2017 89  70 - 99 mg/dL Final  . Glucose-Capillary 10/11/2017 117* 70 - 99 mg/dL Final  . Glucose-Capillary 10/11/2017 106* 70 - 99 mg/dL Final  Hospital Outpatient Visit on 10/02/2017  Component Date Value Ref Range Status  . Hgb A1c MFr Bld 10/02/2017 5.9* 4.8 - 5.6 % Final   Comment: (NOTE)         Prediabetes: 5.7 - 6.4         Diabetes: >6.4         Glycemic control for adults with diabetes: <7.0   . Mean Plasma Glucose 10/02/2017 123  mg/dL Final   Comment: (NOTE) Performed At: [facility] [place], [place] [id number] Rush Farmer MD KG:[id number]   . Glucose-Capillary 10/02/2017 84  70 - 99 mg/dL Final  . Comment 1 10/02/2017 Document in Chart   Final  . ABO/RH(D) 10/02/2017    Final                   Value:O POS Performed at [facility], [place] [address] [place], [place] [address]      X-Rays:Dg Pelvis Portable  Result Date: 10/09/2017 CLINICAL DATA:  Post RIGHT hip replacement EXAM: PORTABLE PELVIS 1-2 VIEWS COMPARISON:  Portable exam 1009 hours compared to  intraoperative images of 10/09/2017 FINDINGS: RIGHT hip prosthesis identified in expected position. No fracture, dislocation, or bone destruction identified on single AP view. Vance degenerative changes of the RIGHT hip joint. Bones appear demineralized. Surgical drain overlies the RIGHT hip region. IMPRESSION: RIGHT hip prosthesis without acute complication on single AP view. Electronically Signed   By: Lavonia Dana M.D.   On: 10/09/2017 13:28   Dg C-arm 1-60 Min-no Report  Result Date: 10/09/2017 Fluoroscopy was utilized by the requesting physician.  No radiographic interpretation.    EKG: Orders placed or performed during the hospital encounter of 10/09/17  . EKG 12-Lead  . EKG  12-Lead     Hospital Course: DARCELLA SHIFFMAN is a 62 y.o. who was admitted to [facility]. They were brought to the operating room on 10/09/2017 and underwent Procedure(s): RIGHT TOTAL HIP ARTHROPLASTY ANTERIOR APPROACH.  Patient tolerated the procedure well and was later transferred to the recovery room and then to the orthopaedic floor for postoperative care. They were given PO and IV analgesics for pain control following their surgery. They were given 24 hours of postoperative antibiotics of  Anti-infectives (From admission, onward)   Start     Dose/Rate Route Frequency Ordered Stop   10/09/17 1400  ceFAZolin (ANCEF) IVPB 2g/100 mL premix     2 g 200 mL/hr over 30 Minutes Intravenous Every 6 hours 10/09/17 1144 10/09/17 2052   10/09/17 0645  ceFAZolin (ANCEF) IVPB 2g/100 mL premix     2 g 200 mL/hr over 30 Minutes Intravenous On call to O.R. 10/09/17 8937 10/09/17 0832   10/09/17 0645  vancomycin (VANCOCIN) IVPB 1000 mg/200 mL premix  Status:  Discontinued     1,000 mg 200 mL/hr over 60 Minutes Intravenous 30 min pre-op 10/09/17 0620 10/11/17 2113   10/09/17 0627  vancomycin (VANCOCIN) 1-5 GM/200ML-% IVPB    Note to Pharmacy:  Waldron Session   : cabinet override      10/09/17 0627 10/09/17 0803     and started on DVT prophylaxis in the form of Aspirin.   PT and OT were ordered for total joint protocol. Discharge planning consulted to help with postop disposition and equipment needs. Patient had a good night on the evening of surgery. They started to get up OOB with therapy on POD #0. Hemovac drain was pulled without difficulty on day one. Continued to work with therapy into POD #2. Pt was seen during rounds on day two and was ready to go home pending progress with therapy. Dressing was changed and the incision was clean, dry, and intact with no drainage. Pt worked with therapy for one additional session and was meeting their goals. She was discharged to home later that day in  stable condition.  Diet: Diabetic diet Activity: WBAT Follow-up: in 2 weeks with Dr. Wynelle Link Disposition: Home with home health physical therapy Discharged Condition: stable   Discharge Instructions    Call MD / Call 911   Complete by:  As directed    If you experience chest pain or shortness of breath, CALL 911 and be transported to the hospital emergency room.  If you develope a fever above 101 F, pus (white drainage) or increased drainage or redness at the wound, or calf pain, call your surgeon's office.   Change dressing   Complete by:  As directed    Change the dressing daily with sterile 4 x 4 inch gauze dressing and paper tape.   Constipation Prevention   Complete by:  As directed    Drink plenty of fluids.  Prune juice may be helpful.  You may use a stool softener, such as Colace (over the counter) 100 mg twice a day.  Use MiraLax (over the counter) for constipation as needed.   Diet - low sodium heart healthy   Complete by:  As directed    Discharge instructions   Complete by:  As directed    Dr. Gaynelle Arabian Total Joint Specialist [facility] [address] [place], [place] [address] [phone]  ANTERIOR APPROACH TOTAL HIP REPLACEMENT POSTOPERATIVE DIRECTIONS   Hip Rehabilitation, Guidelines Following Surgery  The results of a hip operation are greatly improved after range of motion and muscle strengthening exercises. Follow all safety measures which are given to protect your hip. If any of these exercises cause increased pain or swelling in your joint, decrease the amount until you are comfortable again. Then slowly increase the exercises. Call your caregiver if you have problems or questions.   HOME CARE INSTRUCTIONS  Remove items at home which could result in a fall. This includes throw rugs or furniture in walking pathways.  ICE to the affected hip every three hours for 30 minutes at a time and then as needed for pain and swelling.  Continue to use  ice on the hip for pain and swelling from surgery. You may notice swelling that will progress down to the foot and ankle.  This is normal after surgery.  Elevate the leg when you are not up walking on it.   Continue to use the breathing machine which will help keep your temperature down.  It is common for your temperature to cycle up and down following surgery, especially at night when you are not up moving around and exerting yourself.  The breathing machine keeps your lungs expanded and your temperature down.  DIET You may resume your previous home diet once your are discharged from the hospital.  DRESSING / WOUND CARE / SHOWERING You may shower 3 days after surgery, but keep the wounds dry during showering.  You may use an occlusive plastic wrap (Press'n Seal for example), NO SOAKING/SUBMERGING IN THE BATHTUB.  If the bandage gets wet, change with a clean dry gauze.  If the incision gets wet, pat the wound dry with a clean towel. You may start showering once you are discharged home but do not submerge the incision under water. Just pat the incision dry and apply a dry gauze dressing on daily. Change the surgical dressing daily and reapply a dry dressing each time.  ACTIVITY Walk with your walker as instructed. Use walker as long as suggested by your caregivers. Avoid periods of inactivity such as sitting longer than an hour when not asleep. This helps prevent blood clots.  You may resume a sexual relationship in one month or when given the OK by your doctor.  You may return to work once you are cleared by your doctor.  Do not drive a car for 6 weeks or until released by you surgeon.  Do not drive while taking narcotics.  WEIGHT BEARING Weight bearing as tolerated with assist device (walker, cane, etc) as directed, use it as long as suggested by your surgeon or therapist, typically at least 4-6 weeks.  POSTOPERATIVE CONSTIPATION PROTOCOL Constipation - defined medically as fewer than three  stools per week and severe constipation as less than one stool per week.  One of the most common issues patients have following surgery is constipation.  Even if you have a regular bowel pattern at home, your normal regimen is likely to be disrupted due to multiple reasons following surgery.  Combination of anesthesia, postoperative narcotics, change in appetite and fluid intake all can affect your bowels.  In order to avoid complications following surgery, here are some recommendations in order to help you during your recovery period.  Colace (docusate) - Pick up an over-the-counter form of Colace or another stool softener and take twice a day as long as you are requiring postoperative pain medications.  Take with a full glass of water daily.  If you experience loose stools or diarrhea, hold the colace until you stool forms back up.  If your symptoms do not get better within 1 week or if they get worse, check with your doctor.  Dulcolax (bisacodyl) - Pick up over-the-counter and take as directed by the product packaging as needed to assist with the movement of your bowels.  Take with a full glass of water.  Use this product as needed if not relieved by Colace only.   MiraLax (polyethylene glycol) - Pick up over-the-counter to have on hand.  MiraLax is a solution that will increase the amount of water in your bowels to assist with bowel movements.  Take as directed and can mix with a glass of water, juice, soda, coffee, or tea.  Take if you go more than two days without a movement. Do not use MiraLax more than once per day. Call your doctor if you are still constipated or irregular after using this medication for 7 days in a row.  If you continue to have problems with postoperative constipation, please contact the office for further assistance and recommendations.  If you experience "the worst abdominal pain ever" or develop nausea or vomiting, please contact the office immediatly for further  recommendations for treatment.  ITCHING  If you experience itching with your medications, try taking only a single pain pill, or even half a pain pill at a time.  You can also use Benadryl over the counter for itching or also to help with sleep.   TED HOSE STOCKINGS Wear the elastic stockings on both legs for three weeks following surgery during the day but you may remove then at night for sleeping.  MEDICATIONS See your medication summary on the "After Visit Summary" that the nursing staff will review with you prior to discharge.  You may have some home medications which will be placed on hold until you complete the course of blood thinner medication.  It is important for you to complete the blood thinner medication as prescribed by your surgeon.  Continue your approved medications as instructed at time of discharge.  PRECAUTIONS If you experience chest pain or shortness of breath - call 911 immediately for transfer to the hospital emergency department.  If you develop a fever greater that 101 F, purulent drainage from wound, increased redness or drainage from wound, foul odor from the wound/dressing, or calf pain - CONTACT YOUR SURGEON.                                                   FOLLOW-UP APPOINTMENTS Make sure you keep all of your appointments after your operation with your surgeon and caregivers. You should call the office at the above phone number and make an appointment for approximately two  weeks after the date of your surgery or on the date instructed by your surgeon outlined in the "After Visit Summary".  RANGE OF MOTION AND STRENGTHENING EXERCISES  These exercises are designed to help you keep full movement of your hip joint. Follow your caregiver's or physical therapist's instructions. Perform all exercises about fifteen times, three times per day or as directed. Exercise both hips, even if you have had only one joint replacement. These exercises can be done on a training  (exercise) mat, on the floor, on a table or on a bed. Use whatever works the best and is most comfortable for you. Use music or television while you are exercising so that the exercises are a pleasant break in your day. This will make your life better with the exercises acting as a break in routine you can look forward to.  Lying on your back, slowly slide your foot toward your buttocks, raising your knee up off the floor. Then slowly slide your foot back down until your leg is straight again.  Lying on your back spread your legs as far apart as you can without causing discomfort.  Lying on your side, raise your upper leg and foot straight up from the floor as far as is comfortable. Slowly lower the leg and repeat.  Lying on your back, tighten up the muscle in the front of your thigh (quadriceps muscles). You can do this by keeping your leg straight and trying to raise your heel off the floor. This helps strengthen the largest muscle supporting your knee.  Lying on your back, tighten up the muscles of your buttocks both with the legs straight and with the knee bent at a comfortable angle while keeping your heel on the floor.   IF YOU ARE TRANSFERRED TO A SKILLED REHAB FACILITY If the patient is transferred to a skilled rehab facility following release from the hospital, a list of the current medications will be sent to the facility for the patient to continue.  When discharged from the skilled rehab facility, please have the facility set up the patient's [place] prior to being released. Also, the skilled facility will be responsible for providing the patient with their medications at time of release from the facility to include their pain medication, the muscle relaxants, and their blood thinner medication. If the patient is still at the rehab facility at time of the two week follow up appointment, the skilled rehab facility will also need to assist the patient in arranging follow up  appointment in our office and any transportation needs.  MAKE SURE YOU:  Understand these instructions.  Get help right away if you are not doing well or get worse.    Pick up stool softner and laxative for home use following surgery while on pain medications. Do not submerge incision under water. Please use good hand washing techniques while changing dressing each day. May shower starting three days after surgery. Please use a clean towel to pat the incision dry following showers. Continue to use ice for pain and swelling after surgery. Do not use any lotions or creams on the incision until instructed by your surgeon.   Do not sit on low chairs, stoools or toilet seats, as it may be difficult to get up from low surfaces   Complete by:  As directed    Driving restrictions   Complete by:  As directed    No driving for two weeks   TED hose   Complete  by:  As directed    Use stockings (TED hose) for three weeks on both leg(s).  You may remove them at night for sleeping.   Weight bearing as tolerated   Complete by:  As directed      Allergies as of 10/11/2017      Reactions   Crestor [rosuvastatin] Other (See Comments)   myalgia   Lipitor [atorvastatin] Other (See Comments)   myalgia   Lisinopril Other (See Comments)   Worsens asthma   Zetia [ezetimibe] Other (See Comments)   Leg pains      Medication List    TAKE these medications   albuterol 108 (90 Base) MCG/ACT inhaler Commonly known as:  PROVENTIL HFA;VENTOLIN HFA Inhale 2 puffs into the lungs every 6 (six) hours as needed for wheezing or shortness of breath.   aspirin 325 MG EC tablet Take 1 tablet (325 mg total) by mouth daily with breakfast for 19 days. Then resume one 81 mg aspirin once a day. What changed:    medication strength  how much to take  when to take this  additional instructions   Choline Fenofibrate 135 MG capsule Take 135 mg by mouth every morning.   HYDROcodone-acetaminophen 5-325 MG  tablet Commonly known as:  NORCO/VICODIN Take 1-2 tablets by mouth at bedtime as needed for moderate pain.   losartan-hydrochlorothiazide 100-25 MG tablet Commonly known as:  HYZAAR Take 1 tablet by mouth every morning.   metFORMIN 500 MG tablet Commonly known as:  GLUCOPHAGE Take 500 mg by mouth daily with breakfast.   methocarbamol 500 MG tablet Commonly known as:  ROBAXIN Take 1 tablet (500 mg total) by mouth every 6 (six) hours as needed for muscle spasms.   multivitamin with minerals Tabs tablet Take 1 tablet by mouth daily.   RESTASIS 0.05 % ophthalmic emulsion Generic drug:  cycloSPORINE Place 1 drop into both eyes 2 (two) times daily.   traMADol 50 MG tablet Commonly known as:  ULTRAM Take 50-100 mg by mouth daily as needed for moderate pain.            Discharge Care Instructions  (From admission, onward)         Start     Ordered   10/11/17 0000  Weight bearing as tolerated     10/11/17 0711   10/11/17 0000  Change dressing    Comments:  Change the dressing daily with sterile 4 x 4 inch gauze dressing and paper tape.   10/11/17 2297         Follow-up Information    Gaynelle Arabian, MD. Schedule an appointment as soon as possible for a visit on 10/24/2017.   Specialty:  Orthopedic Surgery Contact information: [address] [place] [address] [phone]        [facility] Follow up.   Specialty:  Home Health Services Why:  physical therapy Contact information: [address] [place] [address] [phone]           Signed: Theresa Duty, PA-C Orthopedic Surgery 10/14/2017, 12:48 PM

## 2017-12-13 ENCOUNTER — Other Ambulatory Visit: Payer: Self-pay | Admitting: Family Medicine

## 2017-12-13 DIAGNOSIS — Z1231 Encounter for screening mammogram for malignant neoplasm of breast: Secondary | ICD-10-CM

## 2018-01-08 NOTE — Patient Instructions (Signed)
Tamara Stokes  01/08/2018   Your procedure is scheduled on: 01-22-18    Report to [facility] Main  Entrance                Report to admitting at                 1200  PM    Call this number if you have problems the morning of surgery [phone]    Remember: Do not eat food  :After Midnight. YOU MAY HAVE CLEAR LIQUIDS UNTIL 0830 AM THEN NOTHING BY MOUTH    CLEAR LIQUID DIET   Foods Allowed                                                                     Foods Excluded  Coffee and tea, regular and decaf                             liquids that you cannot  Plain Jell-O in any flavor                                             see through such as: Fruit ices (not with fruit pulp)                                     milk, soups, orange juice  Iced Popsicles                                    All solid food Carbonated beverages, regular and diet                                    Cranberry, grape and apple juices Sports drinks like Gatorade Lightly seasoned clear broth or consume(fat free) Sugar, honey syrup  _____________________________________________________________________    BRUSH YOUR TEETH MORNING OF SURGERY AND RINSE YOUR MOUTH OUT, NO CHEWING GUM CANDY OR MINTS.     Take these medicines the morning of surgery with A SIP OF WATER: eye drops as usual, fenofibrate, inhaler and bring it with you                                You may not have any metal on your body including hair pins and              piercings  Do not wear jewelry, make-up, lotions, powders or perfumes, deodorant             Do not wear nail polish.  Do not shave  48 hours prior to surgery.             Do not bring valuables to the hospital. [place]  NOT             RESPONSIBLE   FOR VALUABLES.  Contacts, dentures or bridgework may not be worn into surgery.  Leave suitcase in the car. After surgery it may be brought to your room.     Special Instructions:  N/A              Please read over the following fact sheets you were given: _____________________________________________________________________           [facility] - Preparing for Surgery Before surgery, you can play an important role.  Because skin is not sterile, your skin needs to be as free of germs as possible.  You can reduce the number of germs on your skin by washing with CHG (chlorahexidine gluconate) soap before surgery.  CHG is an antiseptic cleaner which kills germs and bonds with the skin to continue killing germs even after washing. Please DO NOT use if you have an allergy to CHG or antibacterial soaps.  If your skin becomes reddened/irritated stop using the CHG and inform your nurse when you arrive at Short Stay. Do not shave (including legs and underarms) for at least 48 hours prior to the first CHG shower.  You may shave your face/neck. Please follow these instructions carefully:  1.  Shower with CHG Soap the night before surgery and the  morning of Surgery.  2.  If you choose to wash your hair, wash your hair first as usual with your  normal  shampoo.  3.  After you shampoo, rinse your hair and body thoroughly to remove the  shampoo.                           4.  Use CHG as you would any other liquid soap.  You can apply chg directly  to the skin and wash                       Gently with a scrungie or clean washcloth.  5.  Apply the CHG Soap to your body ONLY FROM THE NECK DOWN.   Do not use on face/ open                           Wound or open sores. Avoid contact with eyes, ears mouth and genitals (private parts).                       Wash face,  Genitals (private parts) with your normal soap.             6.  Wash thoroughly, paying special attention to the area where your surgery  will be performed.  7.  Thoroughly rinse your body with warm water from the neck down.  8.  DO NOT shower/wash with your normal soap after using and rinsing off  the CHG Soap.                 9.  Pat yourself dry with a clean towel.            10.  Wear clean pajamas.            11.  Place clean sheets on your bed the night of your first shower and do not  sleep with pets. Day of Surgery : Do not apply any lotions/deodorants the morning of surgery.  Please  wear clean clothes to the hospital/surgery center.  FAILURE TO FOLLOW THESE INSTRUCTIONS MAY RESULT IN THE CANCELLATION OF YOUR SURGERY PATIENT SIGNATURE_________________________________  NURSE SIGNATURE__________________________________  ________________________________________________________________________  WHAT IS A BLOOD TRANSFUSION? Blood Transfusion Information  A transfusion is the replacement of blood or some of its parts. Blood is made up of multiple cells which provide different functions.  Red blood cells carry oxygen and are used for blood loss replacement.  White blood cells fight against infection.  Platelets control bleeding.  Plasma helps clot blood.  Other blood products are available for specialized needs, such as hemophilia or other clotting disorders. BEFORE THE TRANSFUSION  Who gives blood for transfusions?   Healthy volunteers who are fully evaluated to make sure their blood is safe. This is blood bank blood. Transfusion therapy is the safest it has ever been in the practice of medicine. Before blood is taken from a donor, a complete history is taken to make sure that person has no history of diseases nor engages in risky social behavior (examples are intravenous drug use or sexual activity with multiple partners). The donor's travel history is screened to minimize risk of transmitting infections, such as malaria. The donated blood is tested for signs of infectious diseases, such as HIV and hepatitis. The blood is then tested to be sure it is compatible with you in order to minimize the chance of a transfusion reaction. If you or a relative donates blood, this is often done in anticipation of  surgery and is not appropriate for emergency situations. It takes many days to process the donated blood. RISKS AND COMPLICATIONS Although transfusion therapy is very safe and saves many lives, the main dangers of transfusion include:   Getting an infectious disease.  Developing a transfusion reaction. This is an allergic reaction to something in the blood you were given. Every precaution is taken to prevent this. The decision to have a blood transfusion has been considered carefully by your caregiver before blood is given. Blood is not given unless the benefits outweigh the risks. AFTER THE TRANSFUSION  Right after receiving a blood transfusion, you will usually feel much better and more energetic. This is especially true if your red blood cells have gotten low (anemic). The transfusion raises the level of the red blood cells which carry oxygen, and this usually causes an energy increase.  The nurse administering the transfusion will monitor you carefully for complications. HOME CARE INSTRUCTIONS  No special instructions are needed after a transfusion. You may find your energy is better. Speak with your caregiver about any limitations on activity for underlying diseases you may have. SEEK MEDICAL CARE IF:   Your condition is not improving after your transfusion.  You develop redness or irritation at the intravenous (IV) site. SEEK IMMEDIATE MEDICAL CARE IF:  Any of the following symptoms occur over the next 12 hours:  Shaking chills.  You have a temperature by mouth above 102 F (38.9 C), not controlled by medicine.  Chest, back, or muscle pain.  People around you feel you are not acting correctly or are confused.  Shortness of breath or difficulty breathing.  Dizziness and fainting.  You get a rash or develop hives.  You have a decrease in urine output.  Your urine turns a dark color or changes to pink, red, or brown. Any of the following symptoms occur over the next 10  days:  You have a temperature by mouth above 102 F (38.9 C), not controlled by medicine.  Shortness  of breath.  Weakness after normal activity.  The white part of the eye turns yellow (jaundice).  You have a decrease in the amount of urine or are urinating less often.  Your urine turns a dark color or changes to pink, red, or brown. Document Released: 12/16/1999 Document Revised: 03/12/2011 Document Reviewed: 08/04/2007 ExitCare Patient Information 2014 [place].  _______________________________________________________________________  Incentive Spirometer  An incentive spirometer is a tool that can help keep your lungs clear and active. This tool measures how well you are filling your lungs with each breath. Taking long deep breaths may help reverse or decrease the chance of developing breathing (pulmonary) problems (especially infection) following:  A long period of time when you are unable to move or be active. BEFORE THE PROCEDURE   If the spirometer includes an indicator to show your best effort, your nurse or respiratory therapist will set it to a desired goal.  If possible, sit up straight or lean slightly forward. Try not to slouch.  Hold the incentive spirometer in an upright position. INSTRUCTIONS FOR USE  1. Sit on the edge of your bed if possible, or sit up as far as you can in bed or on a chair. 2. Hold the incentive spirometer in an upright position. 3. Breathe out normally. 4. Place the mouthpiece in your mouth and seal your lips tightly around it. 5. Breathe in slowly and as deeply as possible, raising the piston or the ball toward the top of the column. 6. Hold your breath for 3-5 seconds or for as long as possible. Allow the piston or ball to fall to the bottom of the column. 7. Remove the mouthpiece from your mouth and breathe out normally. 8. Rest for a few seconds and repeat Steps 1 through 7 at least 10 times every 1-2 hours when you are awake.  Take your time and take a few normal breaths between deep breaths. 9. The spirometer may include an indicator to show your best effort. Use the indicator as a goal to work toward during each repetition. 10. After each set of 10 deep breaths, practice coughing to be sure your lungs are clear. If you have an incision (the cut made at the time of surgery), support your incision when coughing by placing a pillow or rolled up towels firmly against it. Once you are able to get out of bed, walk around indoors and cough well. You may stop using the incentive spirometer when instructed by your caregiver.  RISKS AND COMPLICATIONS  Take your time so you do not get dizzy or light-headed.  If you are in pain, you may need to take or ask for pain medication before doing incentive spirometry. It is harder to take a deep breath if you are having pain. AFTER USE  Rest and breathe slowly and easily.  It can be helpful to keep track of a log of your progress. Your caregiver can provide you with a simple table to help with this. If you are using the spirometer at home, follow these instructions: [place] IF:   You are having difficultly using the spirometer.  You have trouble using the spirometer as often as instructed.  Your pain medication is not giving enough relief while using the spirometer.  You develop fever of 100.5 F (38.1 C) or higher. SEEK IMMEDIATE MEDICAL CARE IF:   You cough up bloody sputum that had not been present before.  You develop fever of 102 F (38.9 C) or greater.  You develop worsening pain at or near the incision site. MAKE SURE YOU:   Understand these instructions.  Will watch your condition.  Will get help right away if you are not doing well or get worse. Document Released: 04/30/2006 Document Revised: 03/12/2011 Document Reviewed: 07/01/2006 [facility] Patient Information 2014 [place],  [place].   ________________________________________________________________________

## 2018-01-08 NOTE — Progress Notes (Signed)
ekg 10-02-17 epic  Clearance Dr. Nancy Fetter 10-23-17 on chart

## 2018-01-14 ENCOUNTER — Other Ambulatory Visit: Payer: Self-pay

## 2018-01-14 ENCOUNTER — Encounter (HOSPITAL_COMMUNITY)
Admission: RE | Admit: 2018-01-14 | Discharge: 2018-01-14 | Disposition: A | Discharge status: Home / Self Care | Payer: Medicare Other | Source: Ambulatory Visit | Attending: Orthopedic Surgery | Admitting: Orthopedic Surgery

## 2018-01-14 ENCOUNTER — Encounter (HOSPITAL_COMMUNITY): Payer: Self-pay

## 2018-01-14 DIAGNOSIS — M1612 Unilateral primary osteoarthritis, left hip: Secondary | ICD-10-CM | POA: Insufficient documentation

## 2018-01-14 DIAGNOSIS — Z01812 Encounter for preprocedural laboratory examination: Secondary | ICD-10-CM | POA: Insufficient documentation

## 2018-01-14 LAB — CBC
HCT: 38 % (ref 36.0–46.0)
Hemoglobin: 12.1 g/dL (ref 12.0–15.0)
MCH: 29.5 pg (ref 26.0–34.0)
MCHC: 31.8 g/dL (ref 30.0–36.0)
MCV: 92.7 fL (ref 80.0–100.0)
PLATELETS: 318 10*3/uL (ref 150–400)
RBC: 4.1 MIL/uL (ref 3.87–5.11)
RDW: 15.6 % — AB (ref 11.5–15.5)
WBC: 8.4 10*3/uL (ref 4.0–10.5)
nRBC: 0 % (ref 0.0–0.2)

## 2018-01-14 LAB — COMPREHENSIVE METABOLIC PANEL
ALT: 12 U/L (ref 0–44)
ANION GAP: 8 (ref 5–15)
AST: 19 U/L (ref 15–41)
Albumin: 4.2 g/dL (ref 3.5–5.0)
Alkaline Phosphatase: 50 U/L (ref 38–126)
BUN: 14 mg/dL (ref 8–23)
CHLORIDE: 108 mmol/L (ref 98–111)
CO2: 27 mmol/L (ref 22–32)
CREATININE: 0.63 mg/dL (ref 0.44–1.00)
Calcium: 9.8 mg/dL (ref 8.9–10.3)
GFR calc non Af Amer: >60 mL/min (ref >60)
Glucose, Bld: 105 mg/dL — ABNORMAL HIGH (ref 70–99)
POTASSIUM: 3.7 mmol/L (ref 3.5–5.1)
SODIUM: 143 mmol/L (ref 135–145)
Total Bilirubin: 0.6 mg/dL (ref 0.3–1.2)
Total Protein: 7.4 g/dL (ref 6.5–8.1)

## 2018-01-14 LAB — SURGICAL PCR SCREEN
MRSA, PCR: POSITIVE — AB
STAPHYLOCOCCUS AUREUS: POSITIVE — AB

## 2018-01-14 LAB — PROTIME-INR
INR: 0.88
Prothrombin Time: 11.9 seconds (ref 11.4–15.2)

## 2018-01-14 LAB — APTT: APTT: 31 s (ref 24–36)

## 2018-01-14 NOTE — H&P (Signed)
TOTAL HIP ADMISSION H&P  Patient is admitted for left total hip arthroplasty.  Subjective:  Chief Complaint: left hip pain  HPI: Tamara Stokes, 63 y.o. female, has a history of pain and functional disability in the left hip(s) due to arthritis and patient has failed non-surgical conservative treatments for greater than 12 weeks to include use of assistive devices and activity modification.  Onset of symptoms was gradual starting 4 years ago with gradually worsening course since that time.The patient noted no past surgery on the left hip(s).  Patient currently rates pain in the left hip at 10 out of 10 with activity. Patient has worsening of pain with activity and weight bearing and pain with passive range of motion. Patient has evidence of severe bone-on-bone osteoarthritis with a collapse of the femoral head by imaging studies. This condition presents safety issues increasing the risk of falls. There is no current active infection.  Patient Active Problem List   Diagnosis Date Noted  . OA (osteoarthritis) of hip 10/09/2017   Past Medical History:  Diagnosis Date  . Asthma    prn inhaler  . Bilateral dry eyes   . Bilateral hip pain   . DDD (degenerative disc disease), lumbar   . History of PID 2007   tubo-ovarian abscess  . Hypertension    followed by pcp  . Leg weakness, bilateral    due to OA bilateral hips  . Mixed hyperlipidemia   . OA (osteoarthritis)    knees  . OA (osteoarthritis) of hip    RIGHT  . OSA on CPAP    per study in epic 11/ 2005 very severe osa (per pt uses every night)  . Osteopenia   . Type 2 diabetes mellitus ([place])    followed by pcp  . Wears glasses     Past Surgical History:  Procedure Laterality Date  . APPENDECTOMY  1983  . CARPAL TUNNEL RELEASE Bilateral right 01/ 2002;  left 01-26-1999 dr Percell Miller  . COLONOSCOPY  last one 2018  . D & C HYSTEROSCOPY W/ RESECTION POLYPS  04-25-2009   dr stringer  @ [facility]  . KNEE ARTHROSCOPY Right 06-13-2006  dr  Percell Miller @[facility]   . KNEE ARTHROSCOPY W/ LATERAL RELEASE Right   . Left total hip arthroplasty     Dr. Wynelle Link 01-22-18   . MULTIPLE TOOTH EXTRACTIONS    . THUMB A-1 PULLEY RELEASE Left 08-14-2007   dr Percell Miller  . TOTAL HIP ARTHROPLASTY Right 10/09/2017   Procedure: RIGHT TOTAL HIP ARTHROPLASTY ANTERIOR APPROACH;  Surgeon: Gaynelle Arabian, MD;  Location: [facility];  Service: Orthopedics;  Laterality: Right;  . UMBILICAL HERNIA REPAIR  1981    No current facility-administered medications for this encounter.    Current Outpatient Medications  Medication Sig Dispense Refill Last Dose  . albuterol (PROVENTIL HFA;VENTOLIN HFA) 108 (90 Base) MCG/ACT inhaler Inhale 1-2 puffs into the lungs every 6 (six) hours as needed for wheezing or shortness of breath.    10/07/2017  . aspirin EC 81 MG tablet Take 162 mg by mouth daily.     . Choline Fenofibrate 135 MG capsule Take 135 mg by mouth every morning.    10/09/2017 at 0330  . losartan-hydrochlorothiazide (HYZAAR) 100-25 MG tablet Take 1 tablet by mouth every morning.    10/08/2017 at Unknown time  . methocarbamol (ROBAXIN) 500 MG tablet Take 500 mg by mouth every 6 (six) hours as needed for muscle spasms.     . RESTASIS 0.05 % ophthalmic emulsion Place 1  drop into both eyes 2 (two) times daily.    10/09/2017 at 0330  . traMADol (ULTRAM) 50 MG tablet Take 50 mg by mouth daily as needed for moderate pain.    10/07/2017  . Ascorbic Acid (VITAMIN C PO) Take 1 tablet by mouth daily.     . Multiple Vitamin (MULTIVITAMIN WITH MINERALS) TABS tablet Take 1 tablet by mouth daily.   10/04/2017   Allergies  Allergen Reactions  . Crestor [Rosuvastatin] Other (See Comments)    myalgia  . Lipitor [Atorvastatin] Other (See Comments)    myalgia  . Lisinopril Other (See Comments)    Worsens asthma  . Zetia [Ezetimibe] Other (See Comments)    Leg pains    Social History   Tobacco Use  . Smoking status: Current Every Day Smoker    Packs/day: 0.20    Years: 30.00    Pack  years: 6.00    Types: Cigarettes  . Smokeless tobacco: Never Used  . Tobacco comment: 10-02-2017  per pt 1pp4d  Substance Use Topics  . Alcohol use: No    Family History  Problem Relation Age of Onset  . Diabetes Mother   . Kidney disease Mother   . Hypertension Mother   . Diabetes Father   . Hypertension Father      Review of Systems  Constitutional: Negative for chills and fever.  HENT: Negative for congestion, sore throat and tinnitus.   Eyes: Negative for double vision, photophobia and pain.  Respiratory: Negative for cough, shortness of breath and wheezing.   Cardiovascular: Negative for chest pain, palpitations and orthopnea.  Gastrointestinal: Negative for heartburn, nausea and vomiting.  Genitourinary: Negative for dysuria, frequency and urgency.  Musculoskeletal: Positive for joint pain.  Neurological: Negative for dizziness, weakness and headaches.    Objective:  Physical Exam  Well nourished and well developed.  General: Alert and oriented x3, cooperative and pleasant, no acute distress.  Head: normocephalic, atraumatic, neck supple.  Eyes: EOMI.  Respiratory: breath sounds clear in all fields, no wheezing, rales, or rhonchi. Cardiovascular: Regular rate and rhythm, no murmurs, gallops or rubs.  Abdomen: non-tender to palpation and soft, normoactive bowel sounds. Musculoskeletal: Left Hip Exam: ROM: No motion of the hip at all. There is no tenderness over the greater trochanter bursa. There is no pain on provocative testing of the hip. Calves soft and nontender. Motor function intact in LE. Strength 5/5 LE bilaterally. Neuro: Distal pulses 2+. Sensation to light touch intact in LE.  Vital signs in last 24 hours: Temp:  [97.9 F (36.6 C)] 97.9 F (36.6 C) (01/14 1003) Pulse Rate:  [64] 64 (01/14 1003) Resp:  [18] 18 (01/14 1003) BP: (144)/(81) 144/81 (01/14 1003) SpO2:  [100 %] 100 % (01/14 1003) Weight:  [87.1 kg] 87.1 kg (01/14  1003)  Labs:   Estimated body mass index is 33.48 kg/m as calculated from the following:   Height as of 01/14/18: 5' 3.5" (1.613 m).   Weight as of 01/14/18: 87.1 kg.   Imaging Review Plain radiographs demonstrate severe degenerative joint disease of the left hip(s). The bone quality appears to be adequate for age and reported activity level.    Preoperative templating of the joint replacement has been completed, documented, and submitted to the Operating Room personnel in order to optimize intra-operative equipment management.     Assessment/Plan:  End stage arthritis, left hip(s)  The patient history, physical examination, clinical judgement of the provider and imaging studies are consistent with end stage degenerative  joint disease of the left hip(s) and total hip arthroplasty is deemed medically necessary. The treatment options including medical management, injection therapy, arthroscopy and arthroplasty were discussed at length. The risks and benefits of total hip arthroplasty were presented and reviewed. The risks due to aseptic loosening, infection, stiffness, dislocation/subluxation,  thromboembolic complications and other imponderables were discussed.  The patient acknowledged the explanation, agreed to proceed with the plan and consent was signed. Patient is being admitted for inpatient treatment for surgery, pain control, PT, OT, prophylactic antibiotics, VTE prophylaxis, progressive ambulation and ADL's and discharge planning.The patient is planning to be discharged home.   Therapy Plans: HHPT versus HEP Disposition: Home with sister Planned DVT Prophylaxis: Aspirin 325 mg BID DME needed: None PCP: Donald Prose, MD TXA: IV Allergies: Statins, ezetimibe, lisinopril Anesthesia Concerns: Sleep apnea BMI: 35.6 Last HgbA1c: 5.7%  - Patient was instructed on what medications to stop prior to surgery. - Follow-up visit in 2 weeks with Dr. Wynelle Link - Begin physical therapy  following surgery - Pre-operative lab work as pre-surgical testing - Prescriptions will be provided in hospital at time of discharge  Theresa Duty, PA-C Orthopedic Surgery [facility] [place]

## 2018-01-14 NOTE — Progress Notes (Addendum)
PCP:Dr. Donald Prose  CARDIOLOGIST: none  INFO IN Epic: ekg 10-02-17  INFO ON CHART: Clearance Dr.Sun 10-23-17 on chart  BLOOD THINNERS AND LAST DOSES: 81mg  asa stop 5 days prior to surgery ____________________________________  PATIENT SYMPTOMS AT TIME OF PREOP: NONE hx: OSA uses Cpap DM no meds MD took her off meds, asthma uses inhaler

## 2018-01-22 ENCOUNTER — Other Ambulatory Visit: Payer: Self-pay

## 2018-01-22 ENCOUNTER — Inpatient Hospital Stay (HOSPITAL_COMMUNITY): Payer: Medicare Other

## 2018-01-22 ENCOUNTER — Encounter (HOSPITAL_COMMUNITY): Payer: Self-pay | Admitting: Certified Registered"

## 2018-01-22 ENCOUNTER — Inpatient Hospital Stay (HOSPITAL_COMMUNITY): Payer: Medicare Other | Admitting: Certified Registered"

## 2018-01-22 ENCOUNTER — Encounter (HOSPITAL_COMMUNITY)
Admission: RE | Disposition: A | Discharge status: Home Health | Payer: Self-pay | Source: Home / Self Care | Attending: Orthopedic Surgery

## 2018-01-22 ENCOUNTER — Inpatient Hospital Stay (HOSPITAL_COMMUNITY)
Admission: RE | Admit: 2018-01-22 | Discharge: 2018-01-27 | DRG: 470 | Disposition: A | Discharge status: Home Health | Payer: Medicare Other | Attending: Orthopedic Surgery | Admitting: Orthopedic Surgery

## 2018-01-22 ENCOUNTER — Inpatient Hospital Stay (HOSPITAL_COMMUNITY): Payer: Medicare Other | Admitting: Physician Assistant

## 2018-01-22 DIAGNOSIS — Z96651 Presence of right artificial knee joint: Secondary | ICD-10-CM | POA: Diagnosis present

## 2018-01-22 DIAGNOSIS — M1612 Unilateral primary osteoarthritis, left hip: Principal | ICD-10-CM | POA: Diagnosis present

## 2018-01-22 DIAGNOSIS — G4733 Obstructive sleep apnea (adult) (pediatric): Secondary | ICD-10-CM | POA: Diagnosis present

## 2018-01-22 DIAGNOSIS — E782 Mixed hyperlipidemia: Secondary | ICD-10-CM | POA: Diagnosis present

## 2018-01-22 DIAGNOSIS — J45909 Unspecified asthma, uncomplicated: Secondary | ICD-10-CM | POA: Diagnosis present

## 2018-01-22 DIAGNOSIS — Z841 Family history of disorders of kidney and ureter: Secondary | ICD-10-CM | POA: Diagnosis not present

## 2018-01-22 DIAGNOSIS — M5136 Other intervertebral disc degeneration, lumbar region: Secondary | ICD-10-CM | POA: Diagnosis present

## 2018-01-22 DIAGNOSIS — E119 Type 2 diabetes mellitus without complications: Secondary | ICD-10-CM | POA: Diagnosis present

## 2018-01-22 DIAGNOSIS — Z8249 Family history of ischemic heart disease and other diseases of the circulatory system: Secondary | ICD-10-CM | POA: Diagnosis not present

## 2018-01-22 DIAGNOSIS — M8580 Other specified disorders of bone density and structure, unspecified site: Secondary | ICD-10-CM | POA: Diagnosis present

## 2018-01-22 DIAGNOSIS — Z833 Family history of diabetes mellitus: Secondary | ICD-10-CM | POA: Diagnosis not present

## 2018-01-22 DIAGNOSIS — F17210 Nicotine dependence, cigarettes, uncomplicated: Secondary | ICD-10-CM | POA: Diagnosis present

## 2018-01-22 DIAGNOSIS — D62 Acute posthemorrhagic anemia: Secondary | ICD-10-CM | POA: Diagnosis not present

## 2018-01-22 DIAGNOSIS — I10 Essential (primary) hypertension: Secondary | ICD-10-CM | POA: Diagnosis present

## 2018-01-22 DIAGNOSIS — M169 Osteoarthritis of hip, unspecified: Secondary | ICD-10-CM | POA: Diagnosis present

## 2018-01-22 DIAGNOSIS — Z96641 Presence of right artificial hip joint: Secondary | ICD-10-CM | POA: Diagnosis present

## 2018-01-22 DIAGNOSIS — Z7982 Long term (current) use of aspirin: Secondary | ICD-10-CM | POA: Diagnosis not present

## 2018-01-22 DIAGNOSIS — M25559 Pain in unspecified hip: Secondary | ICD-10-CM

## 2018-01-22 DIAGNOSIS — M25552 Pain in left hip: Secondary | ICD-10-CM | POA: Diagnosis present

## 2018-01-22 DIAGNOSIS — Z888 Allergy status to other drugs, medicaments and biological substances status: Secondary | ICD-10-CM

## 2018-01-22 DIAGNOSIS — Z96649 Presence of unspecified artificial hip joint: Secondary | ICD-10-CM

## 2018-01-22 HISTORY — PX: TOTAL HIP ARTHROPLASTY: SHX124

## 2018-01-22 LAB — GLUCOSE, CAPILLARY
Glucose-Capillary: 71 mg/dL (ref 70–99)
Glucose-Capillary: 97 mg/dL (ref 70–99)

## 2018-01-22 SURGERY — ARTHROPLASTY, HIP, TOTAL, ANTERIOR APPROACH
Anesthesia: Spinal | Site: Hip | Laterality: Left

## 2018-01-22 MED ORDER — BUPIVACAINE-EPINEPHRINE (PF) 0.25% -1:200000 IJ SOLN
INTRAMUSCULAR | Status: DC | PRN
  Administered 2018-01-22: 30 mL

## 2018-01-22 MED ORDER — METHOCARBAMOL 500 MG IVPB - SIMPLE MED
INTRAVENOUS | Status: AC
  Filled 2018-01-22: qty 50

## 2018-01-22 MED ORDER — PROPOFOL 10 MG/ML IV BOLUS
INTRAVENOUS | Status: AC
  Filled 2018-01-22: qty 20

## 2018-01-22 MED ORDER — ALBUTEROL SULFATE (2.5 MG/3ML) 0.083% IN NEBU: 3.0000 mL | INHALATION_SOLUTION | Freq: Four times a day (QID) | RESPIRATORY_TRACT | Status: DC | PRN

## 2018-01-22 MED ORDER — VANCOMYCIN HCL IN DEXTROSE 1-5 GM/200ML-% IV SOLN
1000.0000 mg | INTRAVENOUS | Status: AC
  Administered 2018-01-22: 1000 mg via INTRAVENOUS
  Filled 2018-01-22: qty 200

## 2018-01-22 MED ORDER — PROPOFOL 10 MG/ML IV BOLUS
INTRAVENOUS | Status: DC | PRN
Start: 2018-01-22 — End: 2018-01-22
  Administered 2018-01-22 (×3): 20 mg via INTRAVENOUS

## 2018-01-22 MED ORDER — PHENOL 1.4 % MT LIQD
1.0000 | OROMUCOSAL | Status: DC | PRN
  Filled 2018-01-22: qty 177

## 2018-01-22 MED ORDER — TRANEXAMIC ACID-NACL 1000-0.7 MG/100ML-% IV SOLN
1000.0000 mg | Freq: Once | INTRAVENOUS | Status: AC
  Administered 2018-01-22: 1000 mg via INTRAVENOUS
  Filled 2018-01-22: qty 100

## 2018-01-22 MED ORDER — ALBUMIN HUMAN 5 % IV SOLN
INTRAVENOUS | Status: DC | PRN
  Administered 2018-01-22: 14:00:00 via INTRAVENOUS

## 2018-01-22 MED ORDER — MENTHOL 3 MG MT LOZG: 1.0000 | LOZENGE | OROMUCOSAL | Status: DC | PRN

## 2018-01-22 MED ORDER — LOSARTAN POTASSIUM 50 MG PO TABS
100.0000 mg | ORAL_TABLET | Freq: Every day | ORAL | Status: DC
  Administered 2018-01-24 – 2018-01-27 (×4): 100 mg via ORAL
  Filled 2018-01-22 (×4): qty 2

## 2018-01-22 MED ORDER — METOCLOPRAMIDE HCL 5 MG PO TABS: 5.0000 mg | ORAL_TABLET | Freq: Three times a day (TID) | ORAL | Status: DC | PRN

## 2018-01-22 MED ORDER — DEXAMETHASONE SODIUM PHOSPHATE 10 MG/ML IJ SOLN: 8.0000 mg | Freq: Once | INTRAMUSCULAR | Status: DC

## 2018-01-22 MED ORDER — ACETAMINOPHEN 10 MG/ML IV SOLN
1000.0000 mg | Freq: Four times a day (QID) | INTRAVENOUS | Status: DC
  Administered 2018-01-22: 1000 mg via INTRAVENOUS
  Filled 2018-01-22: qty 100

## 2018-01-22 MED ORDER — FENTANYL CITRATE (PF) 100 MCG/2ML IJ SOLN
INTRAMUSCULAR | Status: AC
  Filled 2018-01-22: qty 2

## 2018-01-22 MED ORDER — LOSARTAN POTASSIUM-HCTZ 100-25 MG PO TABS: 1.0000 | ORAL_TABLET | Freq: Every morning | ORAL | Status: DC

## 2018-01-22 MED ORDER — MORPHINE SULFATE (PF) 4 MG/ML IV SOLN: 0.5000 mg | INTRAVENOUS | Status: DC | PRN

## 2018-01-22 MED ORDER — METHOCARBAMOL 500 MG PO TABS
500.0000 mg | ORAL_TABLET | Freq: Four times a day (QID) | ORAL | Status: DC | PRN
  Administered 2018-01-23 – 2018-01-26 (×9): 500 mg via ORAL
  Filled 2018-01-22 (×9): qty 1

## 2018-01-22 MED ORDER — STERILE WATER FOR IRRIGATION IR SOLN
Status: DC | PRN
  Administered 2018-01-22: 2000 mL

## 2018-01-22 MED ORDER — MIDAZOLAM HCL 2 MG/2ML IJ SOLN
INTRAMUSCULAR | Status: AC
  Filled 2018-01-22: qty 2

## 2018-01-22 MED ORDER — CEFAZOLIN SODIUM-DEXTROSE 2-4 GM/100ML-% IV SOLN
2.0000 g | INTRAVENOUS | Status: AC
  Administered 2018-01-22: 2 g via INTRAVENOUS
  Filled 2018-01-22: qty 100

## 2018-01-22 MED ORDER — ONDANSETRON HCL 4 MG/2ML IJ SOLN: 4.0000 mg | Freq: Once | INTRAMUSCULAR | Status: DC | PRN

## 2018-01-22 MED ORDER — DIPHENHYDRAMINE HCL 12.5 MG/5ML PO ELIX
12.5000 mg | ORAL_SOLUTION | ORAL | Status: DC | PRN
  Filled 2018-01-22: qty 10

## 2018-01-22 MED ORDER — ONDANSETRON HCL 4 MG/2ML IJ SOLN: 4.0000 mg | Freq: Four times a day (QID) | INTRAMUSCULAR | Status: DC | PRN

## 2018-01-22 MED ORDER — ACETAMINOPHEN 500 MG PO TABS
500.0000 mg | ORAL_TABLET | Freq: Four times a day (QID) | ORAL | Status: AC
  Administered 2018-01-22: 500 mg via ORAL
  Filled 2018-01-22: qty 1

## 2018-01-22 MED ORDER — DEXAMETHASONE SODIUM PHOSPHATE 10 MG/ML IJ SOLN
10.0000 mg | Freq: Once | INTRAMUSCULAR | Status: AC
  Administered 2018-01-25: 10 mg via INTRAVENOUS
  Filled 2018-01-22 (×2): qty 1

## 2018-01-22 MED ORDER — ONDANSETRON HCL 4 MG PO TABS: 4.0000 mg | ORAL_TABLET | Freq: Four times a day (QID) | ORAL | Status: DC | PRN

## 2018-01-22 MED ORDER — MIDAZOLAM HCL 2 MG/2ML IJ SOLN
INTRAMUSCULAR | Status: DC | PRN
  Administered 2018-01-22 (×4): 1 mg via INTRAVENOUS

## 2018-01-22 MED ORDER — DOCUSATE SODIUM 100 MG PO CAPS
100.0000 mg | ORAL_CAPSULE | Freq: Two times a day (BID) | ORAL | Status: DC
  Administered 2018-01-22 – 2018-01-27 (×10): 100 mg via ORAL
  Filled 2018-01-22 (×10): qty 1

## 2018-01-22 MED ORDER — CHLORHEXIDINE GLUCONATE 4 % EX LIQD: 60.0000 mL | Freq: Once | CUTANEOUS | Status: DC

## 2018-01-22 MED ORDER — FLEET ENEMA 7-19 GM/118ML RE ENEM: 1.0000 | ENEMA | Freq: Once | RECTAL | Status: DC | PRN

## 2018-01-22 MED ORDER — SODIUM CHLORIDE 0.9 % IV SOLN
INTRAVENOUS | Status: DC
  Administered 2018-01-22: 16:00:00 via INTRAVENOUS

## 2018-01-22 MED ORDER — METOCLOPRAMIDE HCL 5 MG/ML IJ SOLN: 5.0000 mg | Freq: Three times a day (TID) | INTRAMUSCULAR | Status: DC | PRN

## 2018-01-22 MED ORDER — FENTANYL CITRATE (PF) 100 MCG/2ML IJ SOLN: 25.0000 ug | INTRAMUSCULAR | Status: DC | PRN

## 2018-01-22 MED ORDER — TRANEXAMIC ACID-NACL 1000-0.7 MG/100ML-% IV SOLN
1000.0000 mg | INTRAVENOUS | Status: AC
  Administered 2018-01-22: 1000 mg via INTRAVENOUS
  Filled 2018-01-22: qty 100

## 2018-01-22 MED ORDER — HYDROCHLOROTHIAZIDE 25 MG PO TABS
25.0000 mg | ORAL_TABLET | Freq: Every day | ORAL | Status: DC
  Administered 2018-01-24 – 2018-01-27 (×4): 25 mg via ORAL
  Filled 2018-01-22 (×4): qty 1

## 2018-01-22 MED ORDER — METHOCARBAMOL 500 MG IVPB - SIMPLE MED
500.0000 mg | Freq: Four times a day (QID) | INTRAVENOUS | Status: DC | PRN
  Administered 2018-01-22: 500 mg via INTRAVENOUS
  Filled 2018-01-22: qty 50

## 2018-01-22 MED ORDER — HYDROCODONE-ACETAMINOPHEN 5-325 MG PO TABS
1.0000 | ORAL_TABLET | ORAL | Status: DC | PRN
  Administered 2018-01-22 – 2018-01-23 (×6): 2 via ORAL
  Filled 2018-01-22 (×7): qty 2

## 2018-01-22 MED ORDER — TRAMADOL HCL 50 MG PO TABS
50.0000 mg | ORAL_TABLET | Freq: Four times a day (QID) | ORAL | Status: DC | PRN
  Administered 2018-01-25: 100 mg via ORAL
  Administered 2018-01-26: 50 mg via ORAL
  Filled 2018-01-22: qty 2
  Filled 2018-01-22: qty 1

## 2018-01-22 MED ORDER — DEXAMETHASONE SODIUM PHOSPHATE 10 MG/ML IJ SOLN
INTRAMUSCULAR | Status: DC | PRN
  Administered 2018-01-22: 8 mg via INTRAVENOUS

## 2018-01-22 MED ORDER — PROPOFOL 500 MG/50ML IV EMUL
INTRAVENOUS | Status: DC | PRN
  Administered 2018-01-22: 75 ug/kg/min via INTRAVENOUS

## 2018-01-22 MED ORDER — LACTATED RINGERS IV SOLN
INTRAVENOUS | Status: DC
  Administered 2018-01-22 (×3): via INTRAVENOUS

## 2018-01-22 MED ORDER — FENTANYL CITRATE (PF) 250 MCG/5ML IJ SOLN
INTRAMUSCULAR | Status: DC | PRN
  Administered 2018-01-22 (×2): 50 ug via INTRAVENOUS

## 2018-01-22 MED ORDER — PROPOFOL 10 MG/ML IV BOLUS
INTRAVENOUS | Status: AC
  Filled 2018-01-22: qty 40

## 2018-01-22 MED ORDER — POLYETHYLENE GLYCOL 3350 17 G PO PACK: 17.0000 g | PACK | Freq: Every day | ORAL | Status: DC | PRN

## 2018-01-22 MED ORDER — CYCLOSPORINE 0.05 % OP EMUL
1.0000 [drp] | Freq: Two times a day (BID) | OPHTHALMIC | Status: DC
  Administered 2018-01-22 – 2018-01-26 (×8): 1 [drp] via OPHTHALMIC
  Filled 2018-01-22 (×10): qty 1

## 2018-01-22 MED ORDER — LIDOCAINE 2% (20 MG/ML) 5 ML SYRINGE
INTRAMUSCULAR | Status: DC | PRN
  Administered 2018-01-22: 60 mg via INTRAVENOUS

## 2018-01-22 MED ORDER — CEFAZOLIN SODIUM-DEXTROSE 2-4 GM/100ML-% IV SOLN
2.0000 g | Freq: Four times a day (QID) | INTRAVENOUS | Status: AC
  Administered 2018-01-22 – 2018-01-23 (×2): 2 g via INTRAVENOUS
  Filled 2018-01-22 (×2): qty 100

## 2018-01-22 MED ORDER — BISACODYL 10 MG RE SUPP: 10.0000 mg | Freq: Every day | RECTAL | Status: DC | PRN

## 2018-01-22 MED ORDER — BUPIVACAINE-EPINEPHRINE (PF) 0.25% -1:200000 IJ SOLN
INTRAMUSCULAR | Status: AC
  Filled 2018-01-22: qty 30

## 2018-01-22 MED ORDER — HYDROCODONE-ACETAMINOPHEN 7.5-325 MG PO TABS
1.0000 | ORAL_TABLET | ORAL | Status: DC | PRN
  Administered 2018-01-24 – 2018-01-27 (×14): 2 via ORAL
  Filled 2018-01-22 (×2): qty 2
  Filled 2018-01-22: qty 1
  Filled 2018-01-22 (×8): qty 2
  Filled 2018-01-22: qty 1
  Filled 2018-01-22 (×3): qty 2

## 2018-01-22 MED ORDER — FENTANYL CITRATE (PF) 100 MCG/2ML IJ SOLN
INTRAMUSCULAR | Status: DC | PRN
  Administered 2018-01-22 (×6): 50 ug via INTRAVENOUS

## 2018-01-22 MED ORDER — 0.9 % SODIUM CHLORIDE (POUR BTL) OPTIME
TOPICAL | Status: DC | PRN
  Administered 2018-01-22: 1000 mL

## 2018-01-22 MED ORDER — ASPIRIN EC 325 MG PO TBEC
325.0000 mg | DELAYED_RELEASE_TABLET | Freq: Two times a day (BID) | ORAL | Status: DC
  Administered 2018-01-23 – 2018-01-27 (×9): 325 mg via ORAL
  Filled 2018-01-22 (×9): qty 1

## 2018-01-22 SURGICAL SUPPLY — 39 items
BAG DECANTER FOR FLEXI CONT (MISCELLANEOUS) ×2 IMPLANT
BAG SPEC THK2 15X12 ZIP CLS (MISCELLANEOUS)
BAG ZIPLOCK 12X15 (MISCELLANEOUS) IMPLANT
BLADE SAG 18X100X1.27 (BLADE) ×2 IMPLANT
BLADE SURG SZ10 CARB STEEL (BLADE) ×4 IMPLANT
COVER PERINEAL POST (MISCELLANEOUS) ×2 IMPLANT
COVER SURGICAL LIGHT HANDLE (MISCELLANEOUS) ×2 IMPLANT
COVER WAND RF STERILE (DRAPES) IMPLANT
CUP ACETBLR 48 OD SECTOR II (Hips) ×1 IMPLANT
DECANTER SPIKE VIAL GLASS SM (MISCELLANEOUS) ×2 IMPLANT
DRAPE STERI IOBAN 125X83 (DRAPES) ×2 IMPLANT
DRAPE U-SHAPE 47X51 STRL (DRAPES) ×4 IMPLANT
DRSG ADAPTIC 3X8 NADH LF (GAUZE/BANDAGES/DRESSINGS) ×2 IMPLANT
DRSG MEPILEX BORDER 4X4 (GAUZE/BANDAGES/DRESSINGS) ×2 IMPLANT
DRSG MEPILEX BORDER 4X8 (GAUZE/BANDAGES/DRESSINGS) ×2 IMPLANT
DURAPREP 26ML APPLICATOR (WOUND CARE) ×2 IMPLANT
ELECT REM PT RETURN 15FT ADLT (MISCELLANEOUS) ×2 IMPLANT
EVACUATOR 1/8 PVC DRAIN (DRAIN) ×2 IMPLANT
GLOVE BIO SURGEON STRL SZ8 (GLOVE) ×2 IMPLANT
GLOVE BIOGEL PI IND STRL 8 (GLOVE) ×1 IMPLANT
GLOVE BIOGEL PI INDICATOR 8 (GLOVE) ×1
GOWN STRL REUS W/TWL LRG LVL3 (GOWN DISPOSABLE) ×2 IMPLANT
GOWN STRL REUS W/TWL XL LVL3 (GOWN DISPOSABLE) ×2 IMPLANT
HEAD CERAMIC DELTA 28M 12/14P5 (Head) ×1 IMPLANT
HOLDER FOLEY CATH W/STRAP (MISCELLANEOUS) ×2 IMPLANT
LINER MARATHON 28 48 (Hips) ×1 IMPLANT
MANIFOLD NEPTUNE II (INSTRUMENTS) ×2 IMPLANT
PACK ANTERIOR HIP CUSTOM (KITS) ×2 IMPLANT
STEM FEMORAL SZ5 HIGH ACTIS (Nail) ×1 IMPLANT
STRIP CLOSURE SKIN 1/2X4 (GAUZE/BANDAGES/DRESSINGS) ×3 IMPLANT
SUT ETHIBOND NAB CT1 #1 30IN (SUTURE) ×2 IMPLANT
SUT MNCRL AB 4-0 PS2 18 (SUTURE) ×2 IMPLANT
SUT STRATAFIX 0 PDS 27 VIOLET (SUTURE) ×2
SUT VIC AB 2-0 CT1 27 (SUTURE) ×4
SUT VIC AB 2-0 CT1 TAPERPNT 27 (SUTURE) ×2 IMPLANT
SUTURE STRATFX 0 PDS 27 VIOLET (SUTURE) ×1 IMPLANT
SYR 50ML LL SCALE MARK (SYRINGE) IMPLANT
TRAY FOLEY CATH 14FR (SET/KITS/TRAYS/PACK) ×1 IMPLANT
YANKAUER SUCT BULB TIP 10FT TU (MISCELLANEOUS) ×2 IMPLANT

## 2018-01-22 NOTE — Plan of Care (Signed)

## 2018-01-22 NOTE — Discharge Instructions (Signed)
°Dr. Frank Aluisio °Total Joint Specialist °[facility] °[address] °[place], [place] [address] °[phone] ° °ANTERIOR APPROACH TOTAL HIP REPLACEMENT POSTOPERATIVE DIRECTIONS ° ° °Hip Rehabilitation, Guidelines Following Surgery  °The results of a hip operation are greatly improved after range of motion and muscle strengthening exercises. Follow all safety measures which are given to protect your hip. If any of these exercises cause increased pain or swelling in your joint, decrease the amount until you are comfortable again. Then slowly increase the exercises. Call your caregiver if you have problems or questions.  ° °HOME CARE INSTRUCTIONS  °• Remove items at home which could result in a fall. This includes throw rugs or furniture in walking pathways.  °· ICE to the affected hip every three hours for 30 minutes at a time and then as needed for pain and swelling.  Continue to use ice on the hip for pain and swelling from surgery. You may notice swelling that will progress down to the foot and ankle.  This is normal after surgery.  Elevate the leg when you are not up walking on it.   °· Continue to use the breathing machine which will help keep your temperature down.  It is common for your temperature to cycle up and down following surgery, especially at night when you are not up moving around and exerting yourself.  The breathing machine keeps your lungs expanded and your temperature down. ° °DIET °You may resume your previous home diet once your are discharged from the hospital. ° °DRESSING / WOUND CARE / SHOWERING °You may shower 3 days after surgery, but keep the wounds dry during showering.  You may use an occlusive plastic wrap (Press'n Seal for example), NO SOAKING/SUBMERGING IN THE BATHTUB.  If the bandage gets wet, change with a clean dry gauze.  If the incision gets wet, pat the wound dry with a clean towel. °You may start showering once you are discharged home but do not submerge the  incision under water. Just pat the incision dry and apply a dry gauze dressing on daily. °Change the surgical dressing daily and reapply a dry dressing each time. ° °ACTIVITY °Walk with your walker as instructed. °Use walker as long as suggested by your caregivers. °Avoid periods of inactivity such as sitting longer than an hour when not asleep. This helps prevent blood clots.  °You may resume a sexual relationship in one month or when given the OK by your doctor.  °You may return to work once you are cleared by your doctor.  °Do not drive a car for 6 weeks or until released by you surgeon.  °Do not drive while taking narcotics. ° °WEIGHT BEARING °Weight bearing as tolerated with assist device (walker, cane, etc) as directed, use it as long as suggested by your surgeon or therapist, typically at least 4-6 weeks. ° °POSTOPERATIVE CONSTIPATION PROTOCOL °Constipation - defined medically as fewer than three stools per week and severe constipation as less than one stool per week. ° °One of the most common issues patients have following surgery is constipation.  Even if you have a regular bowel pattern at home, your normal regimen is likely to be disrupted due to multiple reasons following surgery.  Combination of anesthesia, postoperative narcotics, change in appetite and fluid intake all can affect your bowels.  In order to avoid complications following surgery, here are some recommendations in order to help you during your recovery period. ° °Colace (docusate) - Pick up an over-the-counter form   of Colace or another stool softener and take twice a day as long as you are requiring postoperative pain medications.  Take with a full glass of water daily.  If you experience loose stools or diarrhea, hold the colace until you stool forms back up.  If your symptoms do not get better within 1 week or if they get worse, check with your doctor. ° °Dulcolax (bisacodyl) - Pick up over-the-counter and take as directed by the product  packaging as needed to assist with the movement of your bowels.  Take with a full glass of water.  Use this product as needed if not relieved by Colace only.  ° °MiraLax (polyethylene glycol) - Pick up over-the-counter to have on hand.  MiraLax is a solution that will increase the amount of water in your bowels to assist with bowel movements.  Take as directed and can mix with a glass of water, juice, soda, coffee, or tea.  Take if you go more than two days without a movement. °Do not use MiraLax more than once per day. Call your doctor if you are still constipated or irregular after using this medication for 7 days in a row. ° °If you continue to have problems with postoperative constipation, please contact the office for further assistance and recommendations.  If you experience "the worst abdominal pain ever" or develop nausea or vomiting, please contact the office immediatly for further recommendations for treatment. ° °ITCHING ° If you experience itching with your medications, try taking only a single pain pill, or even half a pain pill at a time.  You can also use Benadryl over the counter for itching or also to help with sleep.  ° °TED HOSE STOCKINGS °Wear the elastic stockings on both legs for three weeks following surgery during the day but you may remove then at night for sleeping. ° °MEDICATIONS °See your medication summary on the “After Visit Summary” that the nursing staff will review with you prior to discharge.  You may have some home medications which will be placed on hold until you complete the course of blood thinner medication.  It is important for you to complete the blood thinner medication as prescribed by your surgeon.  Continue your approved medications as instructed at time of discharge. ° °PRECAUTIONS °If you experience chest pain or shortness of breath - call 911 immediately for transfer to the hospital emergency department.  °If you develop a fever greater that 101 F, purulent drainage  from wound, increased redness or drainage from wound, foul odor from the wound/dressing, or calf pain - CONTACT YOUR SURGEON.   °                                                °FOLLOW-UP APPOINTMENTS °Make sure you keep all of your appointments after your operation with your surgeon and caregivers. You should call the office at the above phone number and make an appointment for approximately two weeks after the date of your surgery or on the date instructed by your surgeon outlined in the "After Visit Summary". ° °RANGE OF MOTION AND STRENGTHENING EXERCISES  °These exercises are designed to help you keep full movement of your hip joint. Follow your caregiver's or physical therapist's instructions. Perform all exercises about fifteen times, three times per day or as directed. Exercise both hips, even if you have   had only one joint replacement. These exercises can be done on a training (exercise) mat, on the floor, on a table or on a bed. Use whatever works the best and is most comfortable for you. Use music or television while you are exercising so that the exercises are a pleasant break in your day. This will make your life better with the exercises acting as a break in routine you can look forward to.  °• Lying on your back, slowly slide your foot toward your buttocks, raising your knee up off the floor. Then slowly slide your foot back down until your leg is straight again.  °• Lying on your back spread your legs as far apart as you can without causing discomfort.  °• Lying on your side, raise your upper leg and foot straight up from the floor as far as is comfortable. Slowly lower the leg and repeat.  °• Lying on your back, tighten up the muscle in the front of your thigh (quadriceps muscles). You can do this by keeping your leg straight and trying to raise your heel off the floor. This helps strengthen the largest muscle supporting your knee.  °• Lying on your back, tighten up the muscles of your buttocks both  with the legs straight and with the knee bent at a comfortable angle while keeping your heel on the floor.  ° °IF YOU ARE TRANSFERRED TO A SKILLED REHAB FACILITY °If the patient is transferred to a skilled rehab facility following release from the hospital, a list of the current medications will be sent to the facility for the patient to continue.  When discharged from the skilled rehab facility, please have the facility set up the patient's Home Health Physical Therapy prior to being released. Also, the skilled facility will be responsible for providing the patient with their medications at time of release from the facility to include their pain medication, the muscle relaxants, and their blood thinner medication. If the patient is still at the rehab facility at time of the two week follow up appointment, the skilled rehab facility will also need to assist the patient in arranging follow up appointment in our office and any transportation needs. ° °MAKE SURE YOU:  °• Understand these instructions.  °• Get help right away if you are not doing well or get worse.  ° ° °Pick up stool softner and laxative for home use following surgery while on pain medications. °Do not submerge incision under water. °Please use good hand washing techniques while changing dressing each day. °May shower starting three days after surgery. °Please use a clean towel to pat the incision dry following showers. °Continue to use ice for pain and swelling after surgery. °Do not use any lotions or creams on the incision until instructed by your surgeon. ° °

## 2018-01-22 NOTE — Anesthesia Postprocedure Evaluation (Signed)
Anesthesia Post Note  Patient: Tamara Stokes  Procedure(s) Performed: TOTAL HIP ARTHROPLASTY ANTERIOR APPROACH (Left Hip)     Patient location during evaluation: PACU Anesthesia Type: Spinal Level of consciousness: awake and alert Pain management: pain level controlled Vital Signs Assessment: post-procedure vital signs reviewed and stable Respiratory status: spontaneous breathing and respiratory function stable Cardiovascular status: blood pressure returned to baseline and stable Postop Assessment: spinal receding Anesthetic complications: no    Last Vitals:  Vitals:   01/22/18 1530 01/22/18 1555  BP: 119/85 136/89  Pulse: 74 74  Resp: 12 18  Temp:  36.6 C  SpO2: 97% 98%    Last Pain:  Vitals:   01/22/18 1555  TempSrc: Oral  PainSc:                  [name]

## 2018-01-22 NOTE — Anesthesia Procedure Notes (Signed)
Spinal  Patient location during procedure: OR End time: 01/22/2018 12:55 PM Staffing Resident/CRNA: Cynda Familia, CRNA Performed: resident/CRNA  Preanesthetic Checklist Completed: patient identified, site marked, surgical consent, pre-op evaluation, timeout performed, IV checked, risks and benefits discussed and monitors and equipment checked Spinal Block Patient position: sitting Prep: Betadine, ChloraPrep and site prepped and draped Patient monitoring: heart rate, continuous pulse ox, blood pressure and cardiac monitor Approach: midline Location: L3-4 Injection technique: single-shot Needle Needle type: Sprotte  Needle gauge: 24 G Additional Notes Expiration date of tray noted and within date.   Patient tolerated procedure well. Prep dry at time of injection-- Ola Spurr assisted and supervised

## 2018-01-22 NOTE — Anesthesia Preprocedure Evaluation (Signed)
Anesthesia Evaluation  Patient identified by MRN, date of birth, ID band Patient awake    Reviewed: Allergy & Precautions, NPO status , Patient's Chart, lab work & pertinent test results  Airway Mallampati: III  TM Distance: >3 FB Neck ROM: Full    Dental   Pulmonary asthma , sleep apnea , Current Smoker,    breath sounds clear to auscultation       Cardiovascular hypertension, Pt. on medications  Rhythm:Regular Rate:Normal     Neuro/Psych negative neurological ROS     GI/Hepatic negative GI ROS, Neg liver ROS,   Endo/Other  diabetes, Type 2  Renal/GU negative Renal ROS     Musculoskeletal  (+) Arthritis ,   Abdominal   Peds  Hematology negative hematology ROS (+)   Anesthesia Other Findings   Reproductive/Obstetrics                             Lab Results  Component Value Date   WBC 8.4 01/14/2018   HGB 12.1 01/14/2018   HCT 38.0 01/14/2018   MCV 92.7 01/14/2018   PLT 318 01/14/2018   Lab Results  Component Value Date   CREATININE 0.63 01/14/2018   BUN 14 01/14/2018   NA 143 01/14/2018   K 3.7 01/14/2018   CL 108 01/14/2018   CO2 27 01/14/2018    Anesthesia Physical Anesthesia Plan  ASA: II  Anesthesia Plan: Spinal   Post-op Pain Management:    Induction: Intravenous  PONV Risk Score and Plan: 1 and Ondansetron, Propofol infusion and Treatment may vary due to age or medical condition  Airway Management Planned: Simple Face Mask and Natural Airway  Additional Equipment:   Intra-op Plan:   Post-operative Plan:   Informed Consent: I have reviewed the patients History and Physical, chart, labs and discussed the procedure including the risks, benefits and alternatives for the proposed anesthesia with the patient or authorized representative who has indicated his/her understanding and acceptance.       Plan Discussed with: CRNA  Anesthesia Plan Comments:          Anesthesia Quick Evaluation

## 2018-01-22 NOTE — Transfer of Care (Signed)
Immediate Anesthesia Transfer of Care Note  Patient: Armonii Sieh Kerschner  Procedure(s) Performed: TOTAL HIP ARTHROPLASTY ANTERIOR APPROACH (Left Hip)  Patient Location: PACU  Anesthesia Type:Spinal  Level of Consciousness: awake and alert   Airway & Oxygen Therapy: Patient Spontanous Breathing and Patient connected to face mask oxygen  Post-op Assessment: Report given to RN and Post -op Vital signs reviewed and stable  Post vital signs: Reviewed and stable  Last Vitals:  Vitals Value Taken Time  BP 148/94 01/22/2018  2:51 PM  Temp    Pulse 82 01/22/2018  2:54 PM  Resp 18 01/22/2018  2:54 PM  SpO2 100 % 01/22/2018  2:54 PM  Vitals shown include unvalidated device data.  Last Pain:  Vitals:   01/22/18 1134  TempSrc: Oral  PainSc: 2       Patients Stated Pain Goal: 4 (76/72/09 4709)  Complications: Spinal? "patchy" intraop-- sedation- VSS in PACU

## 2018-01-22 NOTE — Op Note (Signed)
OPERATIVE REPORT- TOTAL HIP ARTHROPLASTY   PREOPERATIVE DIAGNOSIS: Osteoarthritis of the Left hip.   POSTOPERATIVE DIAGNOSIS: Osteoarthritis of the Left  hip.   PROCEDURE: Left total hip arthroplasty, anterior approach.   SURGEON: Gaynelle Arabian, MD   ASSISTANT: Griffith Citron, PA-C  ANESTHESIA:  Spinal  ESTIMATED BLOOD LOSS:-750 mL    DRAINS: Hemovac x1.   COMPLICATIONS: None   CONDITION: PACU - hemodynamically stable.   BRIEF CLINICAL NOTE: Tamara Stokes is a 63 y.o. female who has advanced end-  stage arthritis of their Left  hip with progressively worsening pain and  dysfunction.The patient has failed nonoperative management and presents for  total hip arthroplasty.   PROCEDURE IN DETAIL: After successful administration of spinal  anesthetic, the traction boots for the [facility] bed were placed on both  feet and the patient was placed onto the [facility] bed, boots placed into the leg  holders. The Left hip was then isolated from the perineum with plastic  drapes and prepped and draped in the usual sterile fashion. ASIS and  greater trochanter were marked and a oblique incision was made, starting  at about 1 cm lateral and 2 cm distal to the ASIS and coursing towards  the anterior cortex of the femur. The skin was cut with a 10 blade  through subcutaneous tissue to the level of the fascia overlying the  tensor fascia lata muscle. The fascia was then incised in line with the  incision at the junction of the anterior third and posterior 2/3rd. The  muscle was teased off the fascia and then the interval between the TFL  and the rectus was developed. The Hohmann retractor was then placed at  the top of the femoral neck over the capsule. The vessels overlying the  capsule were cauterized and the fat on top of the capsule was removed.  A Hohmann retractor was then placed anterior underneath the rectus  femoris to give exposure to the entire anterior capsule. A T-shaped   capsulotomy was performed. The edges were tagged and the femoral head  was identified.       Osteophytes are removed off the superior acetabulum.  The femoral neck was then cut in situ with an oscillating saw. Traction  was then applied to the left lower extremity utilizing the [facility]  traction. The femoral head was then removed. Retractors were placed  around the acetabulum and then circumferential removal of the labrum was  performed. Osteophytes were also removed. Reaming starts at 45 mm to  medialize and  Increased in 2 mm increments to 47 mm. We reamed in  approximately 40 degrees of abduction, 20 degrees anteversion. A 48 mm  pinnacle acetabular shell was then impacted in anatomic position under  fluoroscopic guidance with excellent purchase. We did not need to place  any additional dome screws. A 28 mm neutral + 4 marathon liner was then  placed into the acetabular shell.       The femoral lift was then placed along the lateral aspect of the femur  just distal to the vastus ridge. The leg was  externally rotated and capsule  was stripped off the inferior aspect of the femoral neck down to the  level of the lesser trochanter, this was done with electrocautery. The femur was lifted after this was performed. The  leg was then placed in an extended and adducted position essentially delivering the femur. We also removed the capsule superiorly and the piriformis from the piriformis  fossa to gain excellent exposure of the  proximal femur. Rongeur was used to remove some cancellous bone to get  into the lateral portion of the proximal femur for placement of the  initial starter reamer. The starter broaches was placed  the starter broach  and was shown to go down the center of the canal. Broaching  with the Actis system was then performed starting at size 0  coursing  Up to size 5. A size 5 had excellent torsional and rotational  and axial stability. The trial high offset neck was then placed   with a 28 + 5 trial head. The hip was then reduced. We confirmed that  the stem was in the canal both on AP and lateral x-rays. It also has excellent sizing. The hip was reduced with outstanding stability through full extension and full external rotation.. AP pelvis was taken and the leg lengths were measured and found to be equal. Hip was then dislocated again and the femoral head and neck removed. The  femoral broach was removed. Size 5 Actis stem with a high offset  neck was then impacted into the femur following native anteversion. Has  excellent purchase in the canal. Excellent torsional and rotational and  axial stability. It is confirmed to be in the canal on AP and lateral  fluoroscopic views. The 28 + 5 ceramic head was placed and the hip  reduced with outstanding stability. Again AP pelvis was taken and it  confirmed that the leg lengths were equal. The wound was then copiously  irrigated with saline solution and the capsule reattached and repaired  with Ethibond suture. 30 ml of .25% Bupivicaine was  injected into the capsule and into the edge of the tensor fascia lata as well as subcutaneous tissue. The fascia overlying the tensor fascia lata was then closed with a running #1 V-Loc. Subcu was closed with interrupted 2-0 Vicryl and subcuticular running 4-0 Monocryl. Incision was cleaned  and dried. Steri-Strips and a bulky sterile dressing applied. Hemovac  drain was hooked to suction and then the patient was awakened and transported to  recovery in stable condition.        Please note that a surgical assistant was a medical necessity for this procedure to perform it in a safe and expeditious manner. Assistant was necessary to provide appropriate retraction of vital neurovascular structures and to prevent femoral fracture and allow for anatomic placement of the prosthesis.  Gaynelle Arabian, M.D.

## 2018-01-22 NOTE — Interval H&P Note (Signed)
History and Physical Interval Note:  01/22/2018 11:22 AM  Tamara Stokes  has presented today for surgery, with the diagnosis of left hip osteoarthritis  The various methods of treatment have been discussed with the patient and family. After consideration of risks, benefits and other options for treatment, the patient has consented to  Procedure(s) with comments: [place] (Left) - 164min as a surgical intervention .  The patient's history has been reviewed, patient examined, no change in status, stable for surgery.  I have reviewed the patient's chart and labs.  Questions were answered to the patient's satisfaction.     Pilar Plate [name]

## 2018-01-23 ENCOUNTER — Encounter (HOSPITAL_COMMUNITY): Payer: Self-pay | Admitting: Orthopedic Surgery

## 2018-01-23 LAB — BASIC METABOLIC PANEL
Anion gap: 4 — ABNORMAL LOW (ref 5–15)
BUN: 12 mg/dL (ref 8–23)
CO2: 25 mmol/L (ref 22–32)
Calcium: 8.7 mg/dL — ABNORMAL LOW (ref 8.9–10.3)
Chloride: 108 mmol/L (ref 98–111)
Creatinine, Ser: 0.59 mg/dL (ref 0.44–1.00)
Glucose, Bld: 158 mg/dL — ABNORMAL HIGH (ref 70–99)
Potassium: 3.9 mmol/L (ref 3.5–5.1)
SODIUM: 137 mmol/L (ref 135–145)

## 2018-01-23 LAB — CBC
HCT: 21.8 % — ABNORMAL LOW (ref 36.0–46.0)
Hemoglobin: 7.1 g/dL — ABNORMAL LOW (ref 12.0–15.0)
MCH: 30.1 pg (ref 26.0–34.0)
MCHC: 32.6 g/dL (ref 30.0–36.0)
MCV: 92.4 fL (ref 80.0–100.0)
PLATELETS: 226 10*3/uL (ref 150–400)
RBC: 2.36 MIL/uL — ABNORMAL LOW (ref 3.87–5.11)
RDW: 15.4 % (ref 11.5–15.5)
WBC: 13.4 10*3/uL — ABNORMAL HIGH (ref 4.0–10.5)
nRBC: 0 % (ref 0.0–0.2)

## 2018-01-23 LAB — PREPARE RBC (CROSSMATCH)

## 2018-01-23 LAB — HEMOGLOBIN AND HEMATOCRIT, BLOOD
HCT: 26.2 % — ABNORMAL LOW (ref 36.0–46.0)
Hemoglobin: 8.5 g/dL — ABNORMAL LOW (ref 12.0–15.0)

## 2018-01-23 MED ORDER — METHOCARBAMOL 500 MG PO TABS: 500.0000 mg | ORAL_TABLET | Freq: Four times a day (QID) | ORAL | 0 refills | Status: DC | PRN

## 2018-01-23 MED ORDER — TRAMADOL HCL 50 MG PO TABS: 50.0000 mg | ORAL_TABLET | Freq: Four times a day (QID) | ORAL | 0 refills | Status: DC | PRN

## 2018-01-23 MED ORDER — HYDROCODONE-ACETAMINOPHEN 5-325 MG PO TABS: 1.0000 | ORAL_TABLET | Freq: Four times a day (QID) | ORAL | 0 refills | Status: DC | PRN

## 2018-01-23 MED ORDER — ASPIRIN 325 MG PO TBEC: 325.0000 mg | DELAYED_RELEASE_TABLET | Freq: Two times a day (BID) | ORAL | 0 refills | Status: DC

## 2018-01-23 MED ORDER — SODIUM CHLORIDE 0.9% IV SOLUTION: Freq: Once | INTRAVENOUS | Status: DC

## 2018-01-23 NOTE — Progress Notes (Signed)
Physical Therapy Treatment Patient Details Name: Tamara Stokes MRN: [id number] DOB: 02-21-1955 Today's Date: 01/23/2018    History of Present Illness s/p L DA  THA; PMH: R THA 10/09/17, HTN, DM, CTR    PT Comments    Exercise focused session d/t pt just starting second unit of blood and had  Just gotten back to bed    Follow Up Recommendations  Follow surgeon's recommendation for DC plan and follow-up therapies     Equipment Recommendations  None recommended by PT    Recommendations for Other Services       Precautions / Restrictions Precautions Precautions: Fall Restrictions Weight Bearing Restrictions: No Other Position/Activity Restrictions: WBAT    Mobility  Bed Mobility Overal bed mobility: Needs Assistance Bed Mobility: Supine to Sit     Supine to sit: Min assist     General bed mobility comments: (deferred d/t starting 2nd unit of blood, IV in hand)  Transfers Overall transfer level: Needs assistance Equipment used: Rolling walker (2 wheeled) Transfers: Sit to/from [facility] Sit to Stand: Min assist Stand pivot transfers: Min assist       General transfer comment: cues for hand placement and sequence  Ambulation/Gait                 Stairs             Wheelchair Mobility    Modified Rankin (Stroke Patients Only)       Balance Overall balance assessment: Needs assistance                                          Cognition Arousal/Alertness: Awake/alert Behavior During Therapy: WFL for tasks assessed/performed Overall Cognitive Status: Within Functional Limits for tasks assessed                                        Exercises Total Joint Exercises Ankle Circles/Pumps: AROM;Both;10 reps Quad Sets: AROM;Both;15 reps Short Arc Quad: AROM;Left;15 reps Heel Slides: AAROM;Left;15 reps Hip ABduction/ADduction: AAROM;Left;15 reps    General Comments         Pertinent Vitals/Pain Pain Assessment: 0-10 Pain Score: 3  Pain Location: left hip Pain Descriptors / Indicators: Discomfort;Grimacing Pain Intervention(s): Limited activity within patient's tolerance;Monitored during session;Repositioned;Ice applied    Home Living                      Prior Function            PT Goals (current goals can now be found in the care plan section) Acute Rehab PT Goals Patient Stated Goal: less pain PT Goal Formulation: With patient Time For Goal Achievement: 01/30/18 Potential to Achieve Goals: Good Progress towards PT goals: Progressing toward goals    Frequency    7X/week      PT Plan Current plan remains appropriate    Co-evaluation              AM-PAC PT "6 Clicks" Mobility   Outcome Measure  Help needed turning from your back to your side while in a flat bed without using bedrails?: A Little Help needed moving from lying on your back to sitting on the side of a flat bed without using bedrails?: A Little Help needed moving to and from a  bed to a chair (including a wheelchair)?: A Little Help needed standing up from a chair using your arms (e.g., wheelchair or bedside chair)?: A Little Help needed to walk in hospital room?: A Little Help needed climbing 3-5 steps with a railing? : A Little 6 Click Score: 18    End of Session Equipment Utilized During Treatment: Gait belt Activity Tolerance: Patient tolerated treatment well Patient left: in bed;with call bell/phone within reach;with family/visitor present;with nursing/sitter in room   PT Visit Diagnosis: Difficulty in walking, not elsewhere classified (R26.2)     Time: 0684-0335 PT Time Calculation (min) (ACUTE ONLY): 14 min  Charges:  $Therapeutic Exercise: 8-22 mins                     Kenyon Ana, PT  Pager: [phone] Acute Rehab Dept [facility]): [phone]   01/23/2018    [facility] 01/23/2018, 4:08 PM

## 2018-01-23 NOTE — Progress Notes (Signed)
   Subjective: 1 Day Post-Op Procedure(s) (LRB): TOTAL HIP ARTHROPLASTY ANTERIOR APPROACH (Left) Patient reports pain as mild.   Patient seen in rounds with Dr. Wynelle Link. Patient is well, and has had no acute complaints or problems. Denies chest pain or SOB. Foley catheter removed this AM. No issues overnight.  We will start therapy today.   Objective: Vital signs in last 24 hours: Temp:  [97.4 F (36.3 C)-98.5 F (36.9 C)] 98.5 F (36.9 C) (01/23 0509) Pulse Rate:  [65-96] 92 (01/23 0509) Resp:  [12-18] 17 (01/23 0509) BP: (118-148)/(67-94) 122/72 (01/23 0509) SpO2:  [97 %-100 %] 100 % (01/23 0509) Weight:  [87.1 kg] 87.1 kg (01/22 1134)  Intake/Output from previous day:  Intake/Output Summary (Last 24 hours) at 01/23/2018 0818 Last data filed at 01/23/2018 9702 Gross per 24 hour  Intake 4024.61 ml  Output 3025 ml  Net 999.61 ml    Labs: Recent Labs    01/23/18 0603  HGB 7.1*   Recent Labs    01/23/18 0603  WBC 13.4*  RBC 2.36*  HCT 21.8*  PLT 226   Recent Labs    01/23/18 0603  NA 137  K 3.9  CL 108  CO2 25  BUN 12  CREATININE 0.59  GLUCOSE 158*  CALCIUM 8.7*   Exam: General - Patient is Alert and Oriented Extremity - Neurologically intact Neurovascular intact Sensation intact distally Dorsiflexion/Plantar flexion intact Dressing - dressing C/D/I Motor Function - intact, moving foot and toes well on exam.   Past Medical History:  Diagnosis Date  . Asthma    prn inhaler  . Bilateral dry eyes   . Bilateral hip pain   . DDD (degenerative disc disease), lumbar   . History of PID 2007   tubo-ovarian abscess  . Hypertension    followed by pcp  . Leg weakness, bilateral    due to OA bilateral hips  . Mixed hyperlipidemia   . OA (osteoarthritis)    knees  . OA (osteoarthritis) of hip    RIGHT  . OSA on CPAP    per study in epic 11/ 2005 very severe osa (per pt uses every night)  . Osteopenia   . Type 2 diabetes mellitus ([place])    followed  by pcp  . Wears glasses     Assessment/Plan: 1 Day Post-Op Procedure(s) (LRB): TOTAL HIP ARTHROPLASTY ANTERIOR APPROACH (Left) Active Problems:   OA (osteoarthritis) of hip  Estimated body mass index is 33.48 kg/m as calculated from the following:   Height as of this encounter: 5' 3.5" (1.613 m).   Weight as of this encounter: 87.1 kg. Advance diet Up with therapy  DVT Prophylaxis - Aspirin Weight bearing as tolerated. D/C O2 and pulse ox and try on room air. Hemovac pulled without difficulty, will begin therapy.  Plan is to go Home after hospital stay.  Hemoglobin dropped from 12.1 pre-operatively to 7.1 this AM. Will transfuse 2 units RBCs with post-transfusion H&H.   Theresa Duty, PA-C Orthopedic Surgery 01/23/2018, 8:18 AM

## 2018-01-23 NOTE — Evaluation (Signed)
Physical Therapy Evaluation Patient Details Name: Tamara Stokes MRN: [id number] DOB: 1955-11-05 Today's Date: 01/23/2018   History of Present Illness  s/p L DA  THA; PMH: R THA 10/09/17, HTN, DM, CTR  Clinical Impression  Pt is s/p THA resulting in the deficits listed below (see PT Problem List). * Pt will benefit from skilled PT to increase their independence and safety with mobility to allow discharge to the venue listed below.  Limited activity d/t pt gettign blood (HgB 7.1) mildly dizzy with transfer     Follow Up Recommendations Follow surgeon's recommendation for DC plan and follow-up therapies    Equipment Recommendations  None recommended by PT    Recommendations for Other Services       Precautions / Restrictions Precautions Precautions: Fall Restrictions Weight Bearing Restrictions: No Other Position/Activity Restrictions: WBAT      Mobility  Bed Mobility Overal bed mobility: Needs Assistance Bed Mobility: Supine to Sit     Supine to sit: Min assist     General bed mobility comments: cues for sequence and technique  Transfers Overall transfer level: Needs assistance Equipment used: Rolling walker (2 wheeled) Transfers: Sit to/from [facility] Sit to Stand: Min assist Stand pivot transfers: Min assist       General transfer comment: cues for hand placement and sequence  Ambulation/Gait                Stairs            Wheelchair Mobility    Modified Rankin (Stroke Patients Only)       Balance Overall balance assessment: Needs assistance                                           Pertinent Vitals/Pain Pain Assessment: 0-10    Home Living Family/patient expects to be discharged to:: Private residence Living Arrangements: Alone Available Help at Discharge: (sister assisting prn) Type of Home: House Home Access: Stairs to enter Entrance Stairs-Rails: Chemical engineer of  Steps: 9 Home Layout: One level Home Equipment: Environmental consultant - 2 wheels;Walker - 4 wheels;Cane - single point;Crutches;Bedside commode      Prior Function Level of Independence: Independent with assistive device(s)         Comments: utilized RW  for amb prior to admission     Hand Dominance        Extremity/Trunk Assessment   Upper Extremity Assessment Upper Extremity Assessment: Overall WFL for tasks assessed    Lower Extremity Assessment Lower Extremity Assessment: LLE deficits/detail LLE Deficits / Details: ankle WFL, knee and hip grossly 2+ to 3/5       Communication   Communication: No difficulties  Cognition Arousal/Alertness: Awake/alert Behavior During Therapy: WFL for tasks assessed/performed Overall Cognitive Status: Within Functional Limits for tasks assessed                                        General Comments      Exercises     Assessment/Plan    PT Assessment Patient needs continued PT services  PT Problem List Decreased strength;Decreased activity tolerance;Decreased mobility       PT Treatment Interventions DME instruction;Gait training;Functional mobility training;Therapeutic activities;Stair training;Therapeutic exercise;Patient/family education    PT Goals (Current goals can be found in the  Care Plan section)  Acute Rehab PT Goals Patient Stated Goal: less pain PT Goal Formulation: With patient Time For Goal Achievement: 01/30/18 Potential to Achieve Goals: Good    Frequency 7X/week   Barriers to discharge        Co-evaluation               AM-PAC PT "6 Clicks" Mobility  Outcome Measure Help needed turning from your back to your side while in a flat bed without using bedrails?: A Little Help needed moving from lying on your back to sitting on the side of a flat bed without using bedrails?: A Little Help needed moving to and from a bed to a chair (including a wheelchair)?: A Little Help needed standing up  from a chair using your arms (e.g., wheelchair or bedside chair)?: A Little Help needed to walk in hospital room?: A Little Help needed climbing 3-5 steps with a railing? : A Little 6 Click Score: 18    End of Session Equipment Utilized During Treatment: Gait belt Activity Tolerance: Patient tolerated treatment well Patient left: in chair;with call bell/phone within reach;with chair alarm set   PT Visit Diagnosis: Difficulty in walking, not elsewhere classified (R26.2)    Time: 0981-1914 PT Time Calculation (min) (ACUTE ONLY): 21 min   Charges:   PT Evaluation $PT Eval Low Complexity: 1 Low          Kenyon Ana, PT  Pager: [phone] Acute Rehab Dept [facility]): [phone]   01/23/2018    [facility] 01/23/2018, 1:50 PM

## 2018-01-24 LAB — BASIC METABOLIC PANEL
Anion gap: 6 (ref 5–15)
BUN: 10 mg/dL (ref 8–23)
CO2: 25 mmol/L (ref 22–32)
Calcium: 8.7 mg/dL — ABNORMAL LOW (ref 8.9–10.3)
Chloride: 111 mmol/L (ref 98–111)
Creatinine, Ser: 0.53 mg/dL (ref 0.44–1.00)
GFR calc Af Amer: >60 mL/min (ref >60)
GFR calc non Af Amer: >60 mL/min (ref >60)
GLUCOSE: 116 mg/dL — AB (ref 70–99)
Potassium: 3.9 mmol/L (ref 3.5–5.1)
Sodium: 142 mmol/L (ref 135–145)

## 2018-01-24 LAB — TYPE AND SCREEN
ABO/RH(D): O POS
ANTIBODY SCREEN: NEGATIVE
Unit division: 0
Unit division: 0

## 2018-01-24 LAB — BPAM RBC
Blood Product Expiration Date: 202002222359
Blood Product Expiration Date: 202002222359
ISSUE DATE / TIME: 202001230917
ISSUE DATE / TIME: 202001231536
Unit Type and Rh: 5100
Unit Type and Rh: 5100

## 2018-01-24 LAB — CBC
HEMATOCRIT: 27 % — AB (ref 36.0–46.0)
Hemoglobin: 8.6 g/dL — ABNORMAL LOW (ref 12.0–15.0)
MCH: 29.7 pg (ref 26.0–34.0)
MCHC: 31.9 g/dL (ref 30.0–36.0)
MCV: 93.1 fL (ref 80.0–100.0)
Platelets: 198 10*3/uL (ref 150–400)
RBC: 2.9 MIL/uL — ABNORMAL LOW (ref 3.87–5.11)
RDW: 15.9 % — ABNORMAL HIGH (ref 11.5–15.5)
WBC: 13.2 10*3/uL — ABNORMAL HIGH (ref 4.0–10.5)
nRBC: 0 % (ref 0.0–0.2)

## 2018-01-24 NOTE — Plan of Care (Signed)
Plan of care reviewed and discussed with the patient. 

## 2018-01-24 NOTE — Progress Notes (Signed)
Physical Therapy Treatment Patient Details Name: Tamara Stokes MRN: [id number] DOB: Jul 11, 1955 Today's Date: 01/24/2018    History of Present Illness s/p L DA  THA; PMH: R THA 10/09/17, HTN, DM, CTR    PT Comments    Pt is progressing slowly, she is limited by pain and fatigue. Pt is requiring assist to advance LLE and only able to amb 6'. She has 9 steps to enter her home and do not feel she will be able to do this today--Rn aware, will see how she does in pm  Follow Up Recommendations  Follow surgeon's recommendation for DC plan and follow-up therapies     Equipment Recommendations  None recommended by PT    Recommendations for Other Services       Precautions / Restrictions Precautions Precautions: Fall Restrictions Weight Bearing Restrictions: No Other Position/Activity Restrictions: WBAT    Mobility  Bed Mobility Overal bed mobility: Needs Assistance Bed Mobility: Supine to Sit     Supine to sit: Min assist     General bed mobility comments: cues for sequence and technique, assist with LLE   Transfers Overall transfer level: Needs assistance Equipment used: Rolling walker (2 wheeled) Transfers: Sit to/from [facility] Sit to Stand: Min assist         General transfer comment: cues for hand placement and sequence, assist to rise and stabilize  Ambulation/Gait Ambulation/Gait assistance: Min assist Gait Distance (Feet): 6 Feet Assistive device: Rolling walker (2 wheeled) Gait Pattern/deviations: Step-to pattern;Antalgic     General Gait Details: assist to advance LLE, cues for sequence and RW position   Stairs             Wheelchair Mobility    Modified Rankin (Stroke Patients Only)       Balance                                            Cognition Arousal/Alertness: Awake/alert Behavior During Therapy: WFL for tasks assessed/performed Overall Cognitive Status: Within Functional Limits for  tasks assessed                                        Exercises Total Joint Exercises Ankle Circles/Pumps: AROM;Both;10 reps    General Comments        Pertinent Vitals/Pain Pain Assessment: 0-10 Pain Score: 4  Pain Location: left hip Pain Descriptors / Indicators: Discomfort;Grimacing Pain Intervention(s): Limited activity within patient's tolerance;Monitored during session;Premedicated before session;Repositioned;Ice applied    Home Living                      Prior Function            PT Goals (current goals can now be found in the care plan section) Acute Rehab PT Goals Patient Stated Goal: less pain PT Goal Formulation: With patient Time For Goal Achievement: 01/30/18 Potential to Achieve Goals: Good Progress towards PT goals: Progressing toward goals    Frequency    7X/week      PT Plan Current plan remains appropriate    Co-evaluation              AM-PAC PT "6 Clicks" Mobility   Outcome Measure  Help needed turning from your back to your side while in a  flat bed without using bedrails?: A Little Help needed moving from lying on your back to sitting on the side of a flat bed without using bedrails?: A Little Help needed moving to and from a bed to a chair (including a wheelchair)?: A Little Help needed standing up from a chair using your arms (e.g., wheelchair or bedside chair)?: A Little Help needed to walk in hospital room?: A Little Help needed climbing 3-5 steps with a railing? : A Lot 6 Click Score: 17    End of Session Equipment Utilized During Treatment: Gait belt Activity Tolerance: Patient limited by fatigue;Patient limited by pain Patient left: in chair;with call bell/phone within reach;with chair alarm set Nurse Communication: Mobility status PT Visit Diagnosis: Difficulty in walking, not elsewhere classified (R26.2)     Time: 1101-1130 PT Time Calculation (min) (ACUTE ONLY): 29 min  Charges:  $Gait  Training: 8-22 mins $Therapeutic Activity: 8-22 mins                     Kenyon Ana, PT  Pager: [phone] Acute Rehab Dept [facility]): [phone]   01/24/2018    [facility] 01/24/2018, 11:45 AM

## 2018-01-24 NOTE — Care Management Note (Signed)
Case Management Note  Patient Details  Name: Tamara Stokes MRN: [id number] Date of Birth: 12/22/1955  Subjective/Objective:                  Discharged to home  Action/Plan: Has all needed equpiment and assistance needed.  Expected Discharge Date:  01/24/18               Expected Discharge Plan:  Home/Self Care  In-House Referral:     Discharge planning Services  CM Consult  Post Acute Care Choice:    Choice offered to:     DME Arranged:    DME Agency:     HH Arranged:    HH Agency:     Status of Service:  Completed, signed off  If discussed at [employer] of Stay Meetings, dates discussed:    Additional Comments:  Leeroy Cha, RN 01/24/2018, 9:36 AM

## 2018-01-24 NOTE — Progress Notes (Signed)
   01/24/18 1400  PT Visit Information  Last PT Received On 01/24/18--pt progressing slowly, able to amb only a total of 12' today with RW and  min assist to advance LLE each step;   Assistance Needed +1  History of Present Illness s/p L DA  THA; PMH: R THA 10/09/17, HTN, DM, CTR  Subjective Data  Patient Stated Goal less pain  Precautions  Precautions Fall  Restrictions  Other Position/Activity Restrictions WBAT  Pain Assessment  Pain Assessment 0-10  Pain Score 5  Pain Location left hip  Pain Descriptors / Indicators Discomfort;Grimacing  Pain Intervention(s) Limited activity within patient's tolerance;Monitored during session;Repositioned;Ice applied  Cognition  Arousal/Alertness Awake/alert  Behavior During Therapy WFL for tasks assessed/performed  Overall Cognitive Status Within Functional Limits for tasks assessed  Bed Mobility  Overal bed mobility Needs Assistance  Bed Mobility Sit to Supine  Sit to supine Mod assist  General bed mobility comments assist with LEs and positioning of trunk  Transfers  Overall transfer level Needs assistance  Equipment used Rolling walker (2 wheeled)  Transfers Sit to/from Stand  Sit to Stand Min assist  General transfer comment cues for hand placement and sequence, assist to rise and stabilize  Ambulation/Gait  Ambulation/Gait assistance Min assist  Gait Distance (Feet) 8 Feet (4' more after using BSC)  Assistive device Rolling walker (2 wheeled)  Gait Pattern/deviations Step-to pattern;Antalgic  General Gait Details assist to advance LLE, cues for sequence and RW position  Gait velocity decr  Total Joint Exercises  Ankle Circles/Pumps AROM;Both;10 reps  Quad Sets AROM;Both;5 reps  PT - End of Session  Equipment Utilized During Treatment Gait belt  Activity Tolerance Patient limited by fatigue  Patient left in bed;with call bell/phone within reach;with bed alarm set  Nurse Communication Mobility status   PT - Assessment/Plan  PT  Plan Current plan remains appropriate  PT Visit Diagnosis Difficulty in walking, not elsewhere classified (R26.2)  PT Frequency (ACUTE ONLY) 7X/week  Follow Up Recommendations Follow surgeon's recommendation for DC plan and follow-up therapies  PT equipment None recommended by PT  AM-PAC PT "6 Clicks" Mobility Outcome Measure (Version 2)  Help needed turning from your back to your side while in a flat bed without using bedrails? 3  Help needed moving from lying on your back to sitting on the side of a flat bed without using bedrails? 2  Help needed moving to and from a bed to a chair (including a wheelchair)? 3  Help needed standing up from a chair using your arms (e.g., wheelchair or bedside chair)? 3  Help needed to walk in hospital room? 3  Help needed climbing 3-5 steps with a railing?  2  6 Click Score 16  Consider Recommendation of Discharge To: Home with [facility]  PT Goal Progression  Progress towards PT goals Progressing toward goals  Acute Rehab PT Goals  PT Goal Formulation With patient  Time For Goal Achievement 01/30/18  Potential to Achieve Goals Good  PT Time Calculation  PT Start Time (ACUTE ONLY) 1424  PT Stop Time (ACUTE ONLY) 1457  PT Time Calculation (min) (ACUTE ONLY) 33 min  PT General Charges  $$ ACUTE PT VISIT 1 Visit  PT Treatments  $Gait Training 8-22 mins  $Therapeutic Activity 8-22 mins

## 2018-01-24 NOTE — Progress Notes (Signed)
Theresa Duty, PA was notified regarding the pt not passing PT today. The pt will need to stay an additional night.

## 2018-01-24 NOTE — Progress Notes (Signed)
Pt states that she will self administer CPAP when ready for bed.  RT to monitor and assess as needed.  

## 2018-01-24 NOTE — Progress Notes (Signed)
   Subjective: 2 Days Post-Op Procedure(s) (LRB): TOTAL HIP ARTHROPLASTY ANTERIOR APPROACH (Left) Patient reports pain as mild.   Patient seen in rounds with Dr. Wynelle Link. Patient is well, and has had no acute complaints or problems. States she is ready to go home. Voiding without difficulty and positive flatus. No issues overnight. Denies chest pain or SOB. Plan is to go Home after hospital stay.  Objective: Vital signs in last 24 hours: Temp:  [98.1 F (36.7 C)-99.1 F (37.3 C)] 98.5 F (36.9 C) (01/24 0522) Pulse Rate:  [88-108] 88 (01/24 0522) Resp:  [16-18] 16 (01/24 0522) BP: (110-141)/(59-76) 116/76 (01/24 0522) SpO2:  [96 %-100 %] 98 % (01/24 0522)  Intake/Output from previous day:  Intake/Output Summary (Last 24 hours) at 01/24/2018 0716 Last data filed at 01/24/2018 0522 Gross per 24 hour  Intake 1122.67 ml  Output 1550 ml  Net -427.33 ml    Intake/Output this shift: No intake/output data recorded.  Labs: Recent Labs    01/23/18 0603 01/23/18 2021 01/24/18 0512  HGB 7.1* 8.5* 8.6*   Recent Labs    01/23/18 0603 01/23/18 2021 01/24/18 0512  WBC 13.4*  --  13.2*  RBC 2.36*  --  2.90*  HCT 21.8* 26.2* 27.0*  PLT 226  --  198   Recent Labs    01/23/18 0603 01/24/18 0512  NA 137 142  K 3.9 3.9  CL 108 111  CO2 25 25  BUN 12 10  CREATININE 0.59 0.53  GLUCOSE 158* 116*  CALCIUM 8.7* 8.7*   No results for input(s): LABPT, INR in the last 72 hours.  Exam: General - Patient is Alert and Oriented Extremity - Neurologically intact Neurovascular intact Sensation intact distally Dorsiflexion/Plantar flexion intact Dressing/Incision - clean, dry, no drainage Motor Function - intact, moving foot and toes well on exam.   Past Medical History:  Diagnosis Date  . Asthma    prn inhaler  . Bilateral dry eyes   . Bilateral hip pain   . DDD (degenerative disc disease), lumbar   . History of PID 2007   tubo-ovarian abscess  . Hypertension    followed  by pcp  . Leg weakness, bilateral    due to OA bilateral hips  . Mixed hyperlipidemia   . OA (osteoarthritis)    knees  . OA (osteoarthritis) of hip    RIGHT  . OSA on CPAP    per study in epic 11/ 2005 very severe osa (per pt uses every night)  . Osteopenia   . Type 2 diabetes mellitus ([place])    followed by pcp  . Wears glasses     Assessment/Plan: 2 Days Post-Op Procedure(s) (LRB): TOTAL HIP ARTHROPLASTY ANTERIOR APPROACH (Left) Active Problems:   OA (osteoarthritis) of hip  Estimated body mass index is 33.48 kg/m as calculated from the following:   Height as of this encounter: 5' 3.5" (1.613 m).   Weight as of this encounter: 87.1 kg. Up with therapy D/C IV fluids  DVT Prophylaxis - Aspirin Weight-bearing as tolerated  Hemoglobin improved at 8.6 this AM. Plan for discharge to home with HEP this afternoon after two sessions of PT. Follow-up in the office in 2 weeks.   Theresa Duty, PA-C Orthopedic Surgery 01/24/2018, 7:16 AM

## 2018-01-24 NOTE — Care Management Important Message (Signed)
Important Message  Patient Details  Name: LORIBETH KATICH MRN: [id number] Date of Birth: [birth year]   Medicare Important Message Given:  Yes    Kerin Salen 01/24/2018, 12:14 [place] Message  Patient Details  Name: KALANY DIEKMANN MRN: [id number] Date of Birth: 1955-09-18   Medicare Important Message Given:  Yes    Kerin Salen 01/24/2018, 12:14 PM

## 2018-01-25 LAB — CBC
HCT: 25.9 % — ABNORMAL LOW (ref 36.0–46.0)
Hemoglobin: 8.2 g/dL — ABNORMAL LOW (ref 12.0–15.0)
MCH: 28.8 pg (ref 26.0–34.0)
MCHC: 31.7 g/dL (ref 30.0–36.0)
MCV: 90.9 fL (ref 80.0–100.0)
Platelets: 224 10*3/uL (ref 150–400)
RBC: 2.85 MIL/uL — ABNORMAL LOW (ref 3.87–5.11)
RDW: 15.9 % — ABNORMAL HIGH (ref 11.5–15.5)
WBC: 10.7 10*3/uL — ABNORMAL HIGH (ref 4.0–10.5)
nRBC: 0 % (ref 0.0–0.2)

## 2018-01-25 NOTE — Plan of Care (Signed)
Pt stable overall though remains weak. Mobility is improving overall though remains limited in basis. Pt did get sweaty with mobility though denied dizziness. Sbp was 163. No s/s of distress this am. Will continue with current plan of care.

## 2018-01-25 NOTE — Progress Notes (Signed)
01/25/18 1700  PT Visit Information  Last PT Received On 01/25/18  Pt is progressing toward PT goals. She continues to require min/guard to min assist with all mobility, will see how she does  tomorrow however unsure if she will be able to get up and down her 8-9 steps that she has to enter her home, possibly will have to stay until Monday. Will continue to follow  Assistance Needed +1  History of Present Illness s/p L DA  THA; PMH: R THA 10/09/17, HTN, DM, CTR  Subjective Data  Patient Stated Goal less pain  Precautions  Precautions Fall  Restrictions  Weight Bearing Restrictions No  Other Position/Activity Restrictions WBAT  Pain Assessment  Pain Assessment 0-10  Pain Score 4  Pain Location left hip  Pain Descriptors / Indicators Discomfort;Grimacing  Pain Intervention(s) Limited activity within patient's tolerance;Monitored during session;Premedicated before session;Repositioned;Ice applied  Cognition  Arousal/Alertness Awake/alert  Behavior During Therapy WFL for tasks assessed/performed  Overall Cognitive Status Within Functional Limits for tasks assessed  Bed Mobility  Overal bed mobility Needs Assistance  Bed Mobility Supine to Sit;Sit to Supine  Supine to sit Min guard  Sit to supine Min guard  General bed mobility comments cues for technique, incr time, min/guard for safety  Transfers  Overall transfer level Needs assistance  Equipment used Rolling walker (2 wheeled)  Transfers Sit to/from [facility]  Sit to Stand Min guard  Stand pivot transfers Min guard  General transfer comment cues for hand placement and sequence, min/guard to rise and stabilize  Ambulation/Gait  Ambulation/Gait assistance Min guard  Gait Distance (Feet) 5 Feet  Assistive device Rolling walker (2 wheeled)  Gait Pattern/deviations Step-to pattern;Antalgic  General Gait Details cues for sequence and RW position  Total Joint Exercises  Ankle Circles/Pumps AROM;Both;10 reps   Quad Sets AROM;Both;10 reps  Heel Slides AAROM;Left;15 reps  Hip ABduction/ADduction AAROM;AROM;Left;10 reps  Short Arc Quad AROM;Left;10 reps  PT - End of Session  Equipment Utilized During Treatment Gait belt  Activity Tolerance Patient tolerated treatment well  Patient left in bed;with bed alarm set;with call bell/phone within reach  Nurse Communication Mobility status   PT - Assessment/Plan  PT Plan Current plan remains appropriate  PT Visit Diagnosis Difficulty in walking, not elsewhere classified (R26.2)  PT Frequency (ACUTE ONLY) 7X/week  Follow Up Recommendations Follow surgeon's recommendation for DC plan and follow-up therapies  PT equipment None recommended by PT  AM-PAC PT "6 Clicks" Mobility Outcome Measure (Version 2)  Help needed turning from your back to your side while in a flat bed without using bedrails? 3  Help needed moving from lying on your back to sitting on the side of a flat bed without using bedrails? 3  Help needed moving to and from a bed to a chair (including a wheelchair)? 3  Help needed standing up from a chair using your arms (e.g., wheelchair or bedside chair)? 3  Help needed to walk in hospital room? 3  Help needed climbing 3-5 steps with a railing?  3  6 Click Score 18  Consider Recommendation of Discharge To: Home with [facility]  PT Goal Progression  Progress towards PT goals Progressing toward goals  Acute Rehab PT Goals  PT Goal Formulation With patient  Time For Goal Achievement 01/30/18  Potential to Achieve Goals Good  PT Time Calculation  PT Start Time (ACUTE ONLY) 1530  PT Stop Time (ACUTE ONLY) 1606  PT Time Calculation (min) (ACUTE ONLY) 36 min  PT General Charges  $$ ACUTE PT VISIT 1 Visit  PT Treatments  $Therapeutic Exercise 8-22 mins  $Therapeutic Activity 8-22 mins

## 2018-01-25 NOTE — Progress Notes (Signed)
   Subjective: 3 Days Post-Op Procedure(s) (LRB): TOTAL HIP ARTHROPLASTY ANTERIOR APPROACH (Left)  Pt c/o continued moderate pain Concern from therapy due to assist for activities Anemia symptoms improved after transfusion Would like to have more therapy before d/c Patient reports pain as moderate.  Objective:   VITALS:   Vitals:   01/24/18 2207 01/25/18 0535  BP: 114/61 122/75  Pulse: 92 84  Resp: 16 16  Temp: 98.7 F (37.1 C) 98.7 F (37.1 C)  SpO2: 97% 98%    Left hip incision healing well nv intact distally No rashes or edema distally Mild pain with rom and log roll  LABS Recent Labs    01/23/18 0603 01/23/18 2021 01/24/18 0512 01/25/18 0331  HGB 7.1* 8.5* 8.6* 8.2*  HCT 21.8* 26.2* 27.0* 25.9*  WBC 13.4*  --  13.2* 10.7*  PLT 226  --  198 224    Recent Labs    01/23/18 0603 01/24/18 0512  NA 137 142  K 3.9 3.9  BUN 12 10  CREATININE 0.59 0.53  GLUCOSE 158* 116*     Assessment/Plan: 3 Days Post-Op Procedure(s) (LRB): TOTAL HIP ARTHROPLASTY ANTERIOR APPROACH (Left) Continue PT/OT  Weight bearing as tolerated  Possible d/c tomorrow vs Monday due to family being home and continued therapy Pain management Pulmonary toilet    Brad Luna [place], [place] is now [facility]  [address] [place], [place], [place] [address] Phone: [phone] [email or web address] Facebook  [employer]

## 2018-01-25 NOTE — Care Management Note (Signed)
Case Management Note  Patient Details  Name: Tamara Stokes MRN: [id number] Date of Birth: 1955/11/14  Subjective/Objective:    S/p L DA THA                Action/Plan:  NCM spoke to pt and offered choice for HH/CMS list provided and placed on chart. Pt agreeable to [facility] for [place]. Pt has RW and bedside commode from previous surgery. Sister will assist her at home.    Expected Discharge Date:  01/25/18               Expected Discharge Plan:  Home/Self Care  In-House Referral:  NA  Discharge planning Services  CM Consult  Post Acute Care Choice:  Home Health Choice offered to:  Patient  DME Arranged:  N/A DME Agency:  NA  HH Arranged:  PT [place] Agency:  [facility] (formerly [employer])  Status of Service:  Completed, signed off  If discussed at [employer], dates discussed:    Additional Comments:  Erenest Rasher, RN 01/25/2018, 1:34 PM

## 2018-01-25 NOTE — Progress Notes (Signed)
Physical Therapy Treatment Patient Details Name: Tamara Stokes MRN: [id number] DOB: [birth year] Today's Date: 01/25/2018    History of Present Illness s/p L DA  THA; PMH: R THA 10/09/17, HTN, DM, CTR    PT Comments    Pt progressing, incr amb to 16' this am however pt continues to fatigue easily and with c/o of feeling "clammy"; RN present on PT departure checking vitals   Follow Up Recommendations  Follow surgeon's recommendation for DC plan and follow-up therapies     Equipment Recommendations  None recommended by PT    Recommendations for Other Services       Precautions / Restrictions Precautions Precautions: Fall Restrictions Weight Bearing Restrictions: No Other Position/Activity Restrictions: WBAT    Mobility  Bed Mobility Overal bed mobility: Needs Assistance Bed Mobility: Supine to Sit     Supine to sit: Min guard     General bed mobility comments: cues for technique, incr time, min/guard for safety  Transfers Overall transfer level: Needs assistance Equipment used: Rolling walker (2 wheeled) Transfers: Sit to/from [facility] Sit to Stand: Min guard Stand pivot transfers: Min guard       General transfer comment: cues for hand placement and sequence, min/guard to rise and stabilize  Ambulation/Gait Ambulation/Gait assistance: Min guard Gait Distance (Feet): 16 Feet Assistive device: Rolling walker (2 wheeled) Gait Pattern/deviations: Step-to pattern;Antalgic     General Gait Details: cues for sequence and RW position   Stairs             Wheelchair Mobility    Modified Rankin (Stroke Patients Only)       Balance                                            Cognition Arousal/Alertness: Awake/alert Behavior During Therapy: WFL for tasks assessed/performed Overall Cognitive Status: Within Functional Limits for tasks assessed                                         Exercises Total Joint Exercises Ankle Circles/Pumps: AROM;Both;10 reps Long Arc Quad: AROM;Left;10 reps    General Comments        Pertinent Vitals/Pain Pain Assessment: 0-10 Pain Score: 3  Pain Location: left hip Pain Descriptors / Indicators: Discomfort;Grimacing Pain Intervention(s): Limited activity within patient's tolerance;Monitored during session;Repositioned    Home Living                      Prior Function            PT Goals (current goals can now be found in the care plan section) Acute Rehab PT Goals Patient Stated Goal: less pain PT Goal Formulation: With patient Time For Goal Achievement: 01/30/18 Potential to Achieve Goals: Good Progress towards PT goals: Progressing toward goals    Frequency    7X/week      PT Plan Current plan remains appropriate    Co-evaluation              AM-PAC PT "6 Clicks" Mobility   Outcome Measure  Help needed turning from your back to your side while in a flat bed without using bedrails?: A Little Help needed moving from lying on your back to sitting on the side of a flat bed  without using bedrails?: A Little Help needed moving to and from a bed to a chair (including a wheelchair)?: A Little Help needed standing up from a chair using your arms (e.g., wheelchair or bedside chair)?: A Little Help needed to walk in hospital room?: A Little Help needed climbing 3-5 steps with a railing? : A Little 6 Click Score: 18    End of Session Equipment Utilized During Treatment: Gait belt Activity Tolerance: Patient tolerated treatment well Patient left: in chair;with call bell/phone within reach;with chair alarm set;with nursing/sitter in room Nurse Communication: Mobility status PT Visit Diagnosis: Difficulty in walking, not elsewhere classified (R26.2)     Time: 4196-2229 PT Time Calculation (min) (ACUTE ONLY): 34 min  Charges:  $Gait Training: 8-22 mins $Therapeutic Activity: 8-22 mins                      Kenyon Ana, PT  Pager: [phone] Acute Rehab Dept [facility]): [phone]   01/25/2018    [facility] 01/25/2018, 11:31 AM

## 2018-01-26 LAB — CBC WITH DIFFERENTIAL/PLATELET
Abs Immature Granulocytes: 0.08 10*3/uL — ABNORMAL HIGH (ref 0.00–0.07)
Basophils Absolute: 0 10*3/uL (ref 0.0–0.1)
Basophils Relative: 0 %
Eosinophils Absolute: 0.1 10*3/uL (ref 0.0–0.5)
Eosinophils Relative: 1 %
HCT: 27.7 % — ABNORMAL LOW (ref 36.0–46.0)
HEMOGLOBIN: 8.8 g/dL — AB (ref 12.0–15.0)
Immature Granulocytes: 1 %
Lymphocytes Relative: 18 %
Lymphs Abs: 2.5 10*3/uL (ref 0.7–4.0)
MCH: 28.8 pg (ref 26.0–34.0)
MCHC: 31.8 g/dL (ref 30.0–36.0)
MCV: 90.5 fL (ref 80.0–100.0)
Monocytes Absolute: 1.2 10*3/uL — ABNORMAL HIGH (ref 0.1–1.0)
Monocytes Relative: 8 %
Neutro Abs: 10.1 10*3/uL — ABNORMAL HIGH (ref 1.7–7.7)
Neutrophils Relative %: 72 %
Platelets: 329 10*3/uL (ref 150–400)
RBC: 3.06 MIL/uL — AB (ref 3.87–5.11)
RDW: 15.4 % (ref 11.5–15.5)
WBC: 13.9 10*3/uL — AB (ref 4.0–10.5)
nRBC: 0.2 % (ref 0.0–0.2)

## 2018-01-26 NOTE — Progress Notes (Signed)
Physical Therapy Treatment Patient Details Name: Tamara Stokes MRN: [id number] DOB: 1955-06-13 Today's Date: 01/26/2018    History of Present Illness s/p L DA  THA; PMH: R THA 10/09/17, HTN, DM, CTR    PT Comments    Pt progressing very well today, incr amb distance, she wants to wait until tomorrow to practice stairs. Will see again in pm   Follow Up Recommendations  Follow surgeon's recommendation for DC plan and follow-up therapies     Equipment Recommendations  None recommended by PT    Recommendations for Other Services       Precautions / Restrictions Precautions Precautions: Fall Restrictions Other Position/Activity Restrictions: WBAT    Mobility  Bed Mobility Overal bed mobility: Needs Assistance Bed Mobility: Sit to Supine     Supine to sit: Supervision     General bed mobility comments: incr time, pt able to brign LLE into bed by self assisting with hands  Transfers Overall transfer level: Needs assistance Equipment used: Rolling walker (2 wheeled) Transfers: Sit to/from Stand Sit to Stand: Supervision         General transfer comment: cues for hand placement, pt able to self cue end of session   Ambulation/Gait Ambulation/Gait assistance: Supervision;Min guard Gait Distance (Feet): 80 Feet Assistive device: Rolling walker (2 wheeled) Gait Pattern/deviations: Step-to pattern Gait velocity: decr   General Gait Details: improved wt shift to LLE, increased step length. cues for posture and shoulder depression   Stairs             Wheelchair Mobility    Modified Rankin (Stroke Patients Only)       Balance                                            Cognition Arousal/Alertness: Awake/alert Behavior During Therapy: WFL for tasks assessed/performed Overall Cognitive Status: Within Functional Limits for tasks assessed                                        Exercises      General Comments         Pertinent Vitals/Pain Pain Assessment: 0-10 Pain Location: left hip Pain Descriptors / Indicators: Discomfort;Grimacing Pain Intervention(s): Monitored during session;Repositioned;Ice applied    Home Living                      Prior Function            PT Goals (current goals can now be found in the care plan section) Acute Rehab PT Goals Patient Stated Goal: less pain PT Goal Formulation: With patient Time For Goal Achievement: 01/30/18 Potential to Achieve Goals: Good Progress towards PT goals: Progressing toward goals    Frequency    7X/week      PT Plan Current plan remains appropriate    Co-evaluation              AM-PAC PT "6 Clicks" Mobility   Outcome Measure  Help needed turning from your back to your side while in a flat bed without using bedrails?: A Little Help needed moving from lying on your back to sitting on the side of a flat bed without using bedrails?: A Little Help needed moving to and from a bed to a chair (  including a wheelchair)?: A Little Help needed standing up from a chair using your arms (e.g., wheelchair or bedside chair)?: A Little Help needed to walk in hospital room?: A Little Help needed climbing 3-5 steps with a railing? : A Little 6 Click Score: 18    End of Session Equipment Utilized During Treatment: Gait belt Activity Tolerance: Patient tolerated treatment well Patient left: in bed;with call bell/phone within reach;with family/visitor present   PT Visit Diagnosis: Difficulty in walking, not elsewhere classified (R26.2)     Time: 8088-1103 PT Time Calculation (min) (ACUTE ONLY): 32 min  Charges:  $Gait Training: 23-37 mins                     Kenyon Ana, PT  Pager: [phone] Acute Rehab Dept [facility]): [phone]   01/26/2018    [facility] 01/26/2018, 12:29 PM

## 2018-01-26 NOTE — Progress Notes (Signed)
   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, HTN, DM, CTR  Subjective Data  Patient Stated Goal less pain  Precautions  Precautions Fall  Restrictions  Weight Bearing Restrictions No  Other Position/Activity Restrictions WBAT  Pain Assessment  Pain Assessment 0-10  Pain Location left hip  Pain Descriptors / Indicators Discomfort;Grimacing  Pain Intervention(s) Limited activity within patient's tolerance;Monitored during session;Ice applied  Cognition  Arousal/Alertness Awake/alert  Behavior During Therapy WFL for tasks assessed/performed  Overall Cognitive Status Within Functional Limits for tasks assessed  Total Joint Exercises  Ankle Circles/Pumps AROM;Both;15 reps  [employer] AROM;15 reps;Left  Heel Slides AAROM;Left;15 reps  Hip ABduction/ADduction AAROM;AROM;Left;15 reps  Short Arc Quad AROM;Left;15 reps  Gluteal Sets Both;15 reps  Towel Squeeze Both;15 reps  PT - End of Session  Activity Tolerance Patient tolerated treatment well  Patient left in bed;with bed alarm set;with call bell/phone within reach  Nurse Communication Mobility status   PT - Assessment/Plan  PT Plan Current plan remains appropriate  PT Visit Diagnosis Difficulty in walking, not elsewhere classified (R26.2)  PT Frequency (ACUTE ONLY) 7X/week  Follow Up Recommendations Follow surgeon's recommendation for DC plan and follow-up therapies  PT equipment None recommended by PT  AM-PAC PT "6 Clicks" Mobility Outcome Measure (Version 2)  Help needed turning from your back to your side while in a flat bed without using bedrails? 3  Help needed moving from lying on your back to sitting on the side of a flat bed without using bedrails? 3  Help needed  moving to and from a bed to a chair (including a wheelchair)? 3  Help needed standing up from a chair using your arms (e.g., wheelchair or bedside chair)? 3  Help needed to walk in hospital room? 3  Help needed climbing 3-5 steps with a railing?  3  6 Click Score 18  Consider Recommendation of Discharge To: Home with [facility]  PT Goal Progression  Progress towards PT goals Progressing toward goals  Acute Rehab PT Goals  PT Goal Formulation With patient  Time For Goal Achievement 01/30/18  Potential to Achieve Goals Good  PT Time Calculation  PT Start Time (ACUTE ONLY) 1503  PT Stop Time (ACUTE ONLY) 1522  PT Time Calculation (min) (ACUTE ONLY) 19 min  PT General Charges  $$ ACUTE PT VISIT 1 Visit  PT Treatments  $Therapeutic Exercise 8-22 mins

## 2018-01-26 NOTE — Progress Notes (Addendum)
Subjective: 4 Days Post-Op Procedure(s) (LRB): TOTAL HIP ARTHROPLASTY ANTERIOR APPROACH (Left) Patient reports pain as 3 on 0-10 scale.    Objective: Vital signs in last 24 hours: Temp:  [98.2 F (36.8 C)-98.5 F (36.9 C)] 98.5 F (36.9 C) (01/26 0501) Pulse Rate:  [83-94] 83 (01/26 0501) Resp:  [14-20] 14 (01/26 0501) BP: (117-133)/(64-79) 117/64 (01/26 0501) SpO2:  [98 %-100 %] 100 % (01/26 0501)  Intake/Output from previous day: 01/25 0701 - 01/26 0700 In: 1560 [P.O.:1560] Out: 852 [Urine:852] Intake/Output this shift: Total I/O In: 240 [P.O.:240] Out: 900 [Urine:900]  Recent Labs    01/23/18 2021 01/24/18 0512 01/25/18 0331  HGB 8.5* 8.6* 8.2*   Recent Labs    01/24/18 0512 01/25/18 0331  WBC 13.2* 10.7*  RBC 2.90* 2.85*  HCT 27.0* 25.9*  PLT 198 224   Recent Labs    01/24/18 0512  NA 142  K 3.9  CL 111  CO2 25  BUN 10  CREATININE 0.53  GLUCOSE 116*  CALCIUM 8.7*   No results for input(s): LABPT, INR in the last 72 hours.  Neurologically intact Neurovascular intact Sensation intact distally Dorsiflexion/Plantar flexion intact Compartment soft    Assessment/Plan: 4 Days Post-Op Procedure(s) (LRB): TOTAL HIP ARTHROPLASTY ANTERIOR APPROACH (Left) Advance diet Up with therapy D/C IV fluids Plan for discharge tomorrow  Slow with ADL's. Check CBC after transfusion.  Hemoglobin is 8.8 addendum  Johnn Hai 01/26/2018, 8:13 AM

## 2018-01-27 MED ORDER — ASPIRIN 325 MG PO TBEC: 325.0000 mg | DELAYED_RELEASE_TABLET | Freq: Two times a day (BID) | ORAL | 0 refills | Status: AC

## 2018-01-27 NOTE — Progress Notes (Signed)
   Subjective: 5 Days Post-Op Procedure(s) (LRB): TOTAL HIP ARTHROPLASTY ANTERIOR APPROACH (Left) Patient reports pain as mild.   Patient seen in rounds for Dr. Wynelle Link. Patient is well, and has had no acute complaints or problems. Voiding without difficulty. Reports 2 BMs since surgery. Denies chest pain or SOB. No issues over the weekend. States she is ready to go home. Plan is to go Home after hospital stay.  Objective: Vital signs in last 24 hours: Temp:  [98 F (36.7 C)-98.3 F (36.8 C)] 98 F (36.7 C) (01/27 0445) Pulse Rate:  [75-87] 75 (01/27 0445) Resp:  [14-18] 14 (01/27 0445) BP: (117-128)/(65-79) 128/79 (01/27 0445) SpO2:  [99 %-100 %] 100 % (01/27 0445)  Intake/Output from previous day:  Intake/Output Summary (Last 24 hours) at 01/27/2018 0711 Last data filed at 01/27/2018 0154 Gross per 24 hour  Intake 1800 ml  Output 2700 ml  Net -900 ml    Labs: Recent Labs    01/25/18 0331 01/26/18 0832  HGB 8.2* 8.8*   Recent Labs    01/25/18 0331 01/26/18 0832  WBC 10.7* 13.9*  RBC 2.85* 3.06*  HCT 25.9* 27.7*  PLT 224 329   Exam: General - Patient is Alert and Oriented Extremity - Neurologically intact Neurovascular intact Sensation intact distally Dorsiflexion/Plantar flexion intact Dressing/Incision - clean, dry, with minimal bloody drainage from hemovac port site Motor Function - intact, moving foot and toes well on exam.   Past Medical History:  Diagnosis Date  . Asthma    prn inhaler  . Bilateral dry eyes   . Bilateral hip pain   . DDD (degenerative disc disease), lumbar   . History of PID 2007   tubo-ovarian abscess  . Hypertension    followed by pcp  . Leg weakness, bilateral    due to OA bilateral hips  . Mixed hyperlipidemia   . OA (osteoarthritis)    knees  . OA (osteoarthritis) of hip    RIGHT  . OSA on CPAP    per study in epic 11/ 2005 very severe osa (per pt uses every night)  . Osteopenia   . Type 2 diabetes mellitus ([place])    followed by pcp  . Wears glasses     Assessment/Plan: 5 Days Post-Op Procedure(s) (LRB): TOTAL HIP ARTHROPLASTY ANTERIOR APPROACH (Left) Active Problems:   OA (osteoarthritis) of hip  Estimated body mass index is 33.48 kg/m as calculated from the following:   Height as of this encounter: 5' 3.5" (1.613 m).   Weight as of this encounter: 87.1 kg. Up with therapy  DVT Prophylaxis - Aspirin Weight-bearing as tolerated  Plan for discharge after one session of physical therapy this AM. Follow-up in the office in 2 weeks.   Theresa Duty, PA-C Orthopedic Surgery 01/27/2018, 7:11 AM

## 2018-01-27 NOTE — Progress Notes (Signed)
Physical Therapy Treatment Patient Details Name: Tamara Stokes MRN: [id number] DOB: [birth year] Today's Date: 01/27/2018    History of Present Illness s/p L DA  THA; PMH: R THA 10/09/17, HTN, DM, CTR    PT Comments    Reviewed gait, transfer safety, stairs; assisted pt with donning clothes as this was not done prior to PT and pt was requesting to get dressed and did not want to wait for NT;   pt doing well and ready for d/c; RN aware  Follow Up Recommendations  Follow surgeon's recommendation for DC plan and follow-up therapies     Equipment Recommendations  None recommended by PT    Recommendations for Other Services       Precautions / Restrictions Precautions Precautions: Fall Restrictions Weight Bearing Restrictions: No Other Position/Activity Restrictions: WBAT    Mobility  Bed Mobility                  Transfers Overall transfer level: Needs assistance Equipment used: Rolling walker (2 wheeled) Transfers: Sit to/from Stand Sit to Stand: Modified independent (Device/Increase time)            Ambulation/Gait Ambulation/Gait assistance: Supervision Gait Distance (Feet): 60 Feet Assistive device: Rolling walker (2 wheeled) Gait Pattern/deviations: Step-to pattern Gait velocity: decr   General Gait Details: improved wt shift to LLE, increased step length. cues for posture and shoulder depression   Stairs Stairs: Yes Stairs assistance: Min guard Stair Management: One rail Right;Step to pattern;One rail Left;With cane Number of Stairs: 10 General stair comments: cues for sequence   Wheelchair Mobility    Modified Rankin (Stroke Patients Only)       Balance                                            Cognition Arousal/Alertness: Awake/alert Behavior During Therapy: WFL for tasks assessed/performed Overall Cognitive Status: Within Functional Limits for tasks assessed                                        Exercises      General Comments        Pertinent Vitals/Pain Pain Location: left hip Pain Descriptors / Indicators: Discomfort;Grimacing    Home Living                      Prior Function            PT Goals (current goals can now be found in the care plan section) Acute Rehab PT Goals Patient Stated Goal: less pain PT Goal Formulation: With patient Time For Goal Achievement: 01/30/18 Potential to Achieve Goals: Good Progress towards PT goals: Progressing toward goals    Frequency    7X/week      PT Plan Current plan remains appropriate    Co-evaluation              AM-PAC PT "6 Clicks" Mobility   Outcome Measure  Help needed turning from your back to your side while in a flat bed without using bedrails?: A Little Help needed moving from lying on your back to sitting on the side of a flat bed without using bedrails?: A Little Help needed moving to and from a bed to a chair (including a wheelchair)?: A Little  Help needed standing up from a chair using your arms (e.g., wheelchair or bedside chair)?: A Little Help needed to walk in hospital room?: A Little Help needed climbing 3-5 steps with a railing? : A Little 6 Click Score: 18    End of Session Equipment Utilized During Treatment: Gait belt Activity Tolerance: Patient tolerated treatment well Patient left: in bed;with call bell/phone within reach;with family/visitor present   PT Visit Diagnosis: Difficulty in walking, not elsewhere classified (R26.2)     Time: 1000-1053 PT Time Calculation (min) (ACUTE ONLY): 53 min  Charges:  $Gait Training: 23-37 mins $Self Care/Home Management: 8-22                     Kenyon Ana, PT  Pager: [phone] Acute Rehab Dept [facility]): [phone]   01/27/2018    [facility] 01/27/2018, 11:44 AM

## 2018-01-27 NOTE — Care Management Important Message (Signed)
Important Message  Patient Details  Name: KATHEEN ASLIN MRN: [id number] Date of Birth: Feb 25, 1955   Medicare Important Message Given:  Yes    Kerin Salen 01/27/2018, 1:41 PMImportant Message  Patient Details  Name: MARICELLA FILYAW MRN: [id number] Date of Birth: 07/10/1955   Medicare Important Message Given:  Yes    Kerin Salen 01/27/2018, 1:41 PM

## 2018-01-31 NOTE — Discharge Summary (Signed)
Physician Discharge Summary   Patient ID: TALAR FRALEY MRN: [id number] DOB/AGE: 04/22/1955 63 y.o.  Admit date: 01/22/2018 Discharge date: 01/27/2018  Primary Diagnosis: Osteoarthritis, left hip   Admission Diagnoses:  Past Medical History:  Diagnosis Date  . Asthma    prn inhaler  . Bilateral dry eyes   . Bilateral hip pain   . DDD (degenerative disc disease), lumbar   . History of PID 2007   tubo-ovarian abscess  . Hypertension    followed by pcp  . Leg weakness, bilateral    due to OA bilateral hips  . Mixed hyperlipidemia   . OA (osteoarthritis)    knees  . OA (osteoarthritis) of hip    RIGHT  . OSA on CPAP    per study in epic 11/ 2005 very severe osa (per pt uses every night)  . Osteopenia   . Type 2 diabetes mellitus ([place])    followed by pcp  . Wears glasses    Discharge Diagnoses:   Active Problems:   OA (osteoarthritis) of hip  Estimated body mass index is 33.48 kg/m as calculated from the following:   Height as of this encounter: 5' 3.5" (1.613 m).   Weight as of this encounter: 87.1 kg.  Procedure:  Procedure(s) (LRB): TOTAL HIP ARTHROPLASTY ANTERIOR APPROACH (Left)   Consults: None  HPI: Tamara Stokes is a 63 y.o. female who has advanced end-stage arthritis of their Left  hip with progressively worsening pain and dysfunction.The patient has failed nonoperative management and presents for total hip arthroplasty.   Laboratory Data: Admission on 01/22/2018, Discharged on 01/27/2018  Component Date Value Ref Range Status  . Glucose-Capillary 01/22/2018 71  70 - 99 mg/dL Final  . Comment 1 01/22/2018 Notify RN   Final  . Glucose-Capillary 01/22/2018 97  70 - 99 mg/dL Final  . Comment 1 01/22/2018 Document in Chart   Final  . WBC 01/23/2018 13.4* 4.0 - 10.5 K/uL Final  . RBC 01/23/2018 2.36* 3.87 - 5.11 MIL/uL Final  . Hemoglobin 01/23/2018 7.1* 12.0 - 15.0 g/dL Final  . HCT 01/23/2018 21.8* 36.0 - 46.0 % Final  . MCV 01/23/2018 92.4   80.0 - 100.0 fL Final  . MCH 01/23/2018 30.1  26.0 - 34.0 pg Final  . MCHC 01/23/2018 32.6  30.0 - 36.0 g/dL Final  . RDW 01/23/2018 15.4  11.5 - 15.5 % Final  . Platelets 01/23/2018 226  150 - 400 K/uL Final  . nRBC 01/23/2018 0.0  0.0 - 0.2 % Final   Performed at [facility], [place] [address], [place], [place] [address]  . Sodium 01/23/2018 137  135 - 145 mmol/L Final  . Potassium 01/23/2018 3.9  3.5 - 5.1 mmol/L Final  . Chloride 01/23/2018 108  98 - 111 mmol/L Final  . CO2 01/23/2018 25  22 - 32 mmol/L Final  . Glucose, Bld 01/23/2018 158* 70 - 99 mg/dL Final  . BUN 01/23/2018 12  8 - 23 mg/dL Final  . Creatinine, Ser 01/23/2018 0.59  0.44 - 1.00 mg/dL Final  . Calcium 01/23/2018 8.7* 8.9 - 10.3 mg/dL Final  . GFR calc non Af Amer 01/23/2018 >60  >60 mL/min Final  . GFR calc Af Amer 01/23/2018 >60  >60 mL/min Final  . Anion gap 01/23/2018 4* 5 - 15 Final   Performed at [facility], [place] [address] [place], [place] [address]  . Order Confirmation 01/23/2018    Final  Value:ORDER PROCESSED BY BLOOD BANK Performed at [facility], [place] [address] [place], [place] [address]   . WBC 01/24/2018 13.2* 4.0 - 10.5 K/uL Final  . RBC 01/24/2018 2.90* 3.87 - 5.11 MIL/uL Final  . Hemoglobin 01/24/2018 8.6* 12.0 - 15.0 g/dL Final  . HCT 01/24/2018 27.0* 36.0 - 46.0 % Final  . MCV 01/24/2018 93.1  80.0 - 100.0 fL Final  . MCH 01/24/2018 29.7  26.0 - 34.0 pg Final  . MCHC 01/24/2018 31.9  30.0 - 36.0 g/dL Final  . RDW 01/24/2018 15.9* 11.5 - 15.5 % Final  . Platelets 01/24/2018 198  150 - 400 K/uL Final  . nRBC 01/24/2018 0.0  0.0 - 0.2 % Final   Performed at [facility], [place] [address] [place], [place] [address]  . Sodium 01/24/2018 142  135 - 145 mmol/L Final  . Potassium 01/24/2018 3.9  3.5 - 5.1 mmol/L Final  . Chloride 01/24/2018 111  98 - 111 mmol/L Final  . CO2 01/24/2018 25  22 - 32 mmol/L  Final  . Glucose, Bld 01/24/2018 116* 70 - 99 mg/dL Final  . BUN 01/24/2018 10  8 - 23 mg/dL Final  . Creatinine, Ser 01/24/2018 0.53  0.44 - 1.00 mg/dL Final  . Calcium 01/24/2018 8.7* 8.9 - 10.3 mg/dL Final  . GFR calc non Af Amer 01/24/2018 >60  >60 mL/min Final  . GFR calc Af Amer 01/24/2018 >60  >60 mL/min Final  . Anion gap 01/24/2018 6  5 - 15 Final   Performed at [facility], [place] [address], [place], [place] [address]  . Hemoglobin 01/23/2018 8.5* 12.0 - 15.0 g/dL Final  . HCT 01/23/2018 26.2* 36.0 - 46.0 % Final   Performed at [facility], [place] [address], [place], [place] [address]  . WBC 01/25/2018 10.7* 4.0 - 10.5 K/uL Final  . RBC 01/25/2018 2.85* 3.87 - 5.11 MIL/uL Final  . Hemoglobin 01/25/2018 8.2* 12.0 - 15.0 g/dL Final  . HCT 01/25/2018 25.9* 36.0 - 46.0 % Final  . MCV 01/25/2018 90.9  80.0 - 100.0 fL Final  . MCH 01/25/2018 28.8  26.0 - 34.0 pg Final  . MCHC 01/25/2018 31.7  30.0 - 36.0 g/dL Final  . RDW 01/25/2018 15.9* 11.5 - 15.5 % Final  . Platelets 01/25/2018 224  150 - 400 K/uL Final  . nRBC 01/25/2018 0.0  0.0 - 0.2 % Final   Performed at [facility], [place] [address] [place], [place] [address]  . WBC 01/26/2018 13.9* 4.0 - 10.5 K/uL Final  . RBC 01/26/2018 3.06* 3.87 - 5.11 MIL/uL Final  . Hemoglobin 01/26/2018 8.8* 12.0 - 15.0 g/dL Final  . HCT 01/26/2018 27.7* 36.0 - 46.0 % Final  . MCV 01/26/2018 90.5  80.0 - 100.0 fL Final  . MCH 01/26/2018 28.8  26.0 - 34.0 pg Final  . MCHC 01/26/2018 31.8  30.0 - 36.0 g/dL Final  . RDW 01/26/2018 15.4  11.5 - 15.5 % Final  . Platelets 01/26/2018 329  150 - 400 K/uL Final  . nRBC 01/26/2018 0.2  0.0 - 0.2 % Final  . Neutrophils Relative % 01/26/2018 72  % Final  . Neutro Abs 01/26/2018 10.1* 1.7 - 7.7 K/uL Final  . Lymphocytes Relative 01/26/2018 18  % Final  . Lymphs Abs 01/26/2018 2.5  0.7 - 4.0 K/uL Final  . Monocytes Relative 01/26/2018 8  % Final  .  Monocytes Absolute 01/26/2018 1.2* 0.1 -  1.0 K/uL Final  . Eosinophils Relative 01/26/2018 1  % Final  . Eosinophils Absolute 01/26/2018 0.1  0.0 - 0.5 K/uL Final  . Basophils Relative 01/26/2018 0  % Final  . Basophils Absolute 01/26/2018 0.0  0.0 - 0.1 K/uL Final  . Immature Granulocytes 01/26/2018 1  % Final  . Abs Immature Granulocytes 01/26/2018 0.08* 0.00 - 0.07 K/uL Final   Performed at [facility], [place] [address], [place], [place] [address]  Hospital Outpatient Visit on 01/14/2018  Component Date Value Ref Range Status  . aPTT 01/14/2018 31  24 - 36 seconds Final   Performed at [facility], [place] [address], [place], [place] [address]  . WBC 01/14/2018 8.4  4.0 - 10.5 K/uL Final  . RBC 01/14/2018 4.10  3.87 - 5.11 MIL/uL Final  . Hemoglobin 01/14/2018 12.1  12.0 - 15.0 g/dL Final  . HCT 01/14/2018 38.0  36.0 - 46.0 % Final  . MCV 01/14/2018 92.7  80.0 - 100.0 fL Final  . MCH 01/14/2018 29.5  26.0 - 34.0 pg Final  . MCHC 01/14/2018 31.8  30.0 - 36.0 g/dL Final  . RDW 01/14/2018 15.6* 11.5 - 15.5 % Final  . Platelets 01/14/2018 318  150 - 400 K/uL Final  . nRBC 01/14/2018 0.0  0.0 - 0.2 % Final   Performed at [facility], [place] [address], [place], [place] [address]  . Sodium 01/14/2018 143  135 - 145 mmol/L Final  . Potassium 01/14/2018 3.7  3.5 - 5.1 mmol/L Final  . Chloride 01/14/2018 108  98 - 111 mmol/L Final  . CO2 01/14/2018 27  22 - 32 mmol/L Final  . Glucose, Bld 01/14/2018 105* 70 - 99 mg/dL Final  . BUN 01/14/2018 14  8 - 23 mg/dL Final  . Creatinine, Ser 01/14/2018 0.63  0.44 - 1.00 mg/dL Final  . Calcium 01/14/2018 9.8  8.9 - 10.3 mg/dL Final  . Total Protein 01/14/2018 7.4  6.5 - 8.1 g/dL Final  . Albumin 01/14/2018 4.2  3.5 - 5.0 g/dL Final  . AST 01/14/2018 19  15 - 41 U/L Final  . ALT 01/14/2018 12  0 - 44 U/L Final  . Alkaline Phosphatase 01/14/2018 50  38 - 126 U/L Final  . Total Bilirubin 01/14/2018  0.6  0.3 - 1.2 mg/dL Final  . GFR calc non Af Amer 01/14/2018 >60  >60 mL/min Final  . GFR calc Af Amer 01/14/2018 >60  >60 mL/min Final  . Anion gap 01/14/2018 8  5 - 15 Final   Performed at [facility], [place] [address] [place], [place] [address]  . Prothrombin Time 01/14/2018 11.9  11.4 - 15.2 seconds Final  . INR 01/14/2018 0.88   Final   Performed at [facility], [place] [address], [place], [place] [address]  . ABO/RH(D) 01/14/2018 O POS   Final  . Antibody Screen 01/14/2018 NEG   Final  . Sample Expiration 01/14/2018 01/25/2018   Final  . Extend sample reason 01/14/2018 NO TRANSFUSIONS OR PREGNANCY IN THE PAST 3 MONTHS   Final  . Unit Number 01/14/2018 [id number]   Final  . Blood Component Type 01/14/2018 RBC LR PHER2   Final  . Unit division 01/14/2018 00   Final  . Status of Unit 01/14/2018 ISSUED,FINAL   Final  . Transfusion Status 01/14/2018 OK TO TRANSFUSE   Final  . Crossmatch Result 01/14/2018    Final  Value:Compatible Performed at [facility], [place] [address], [place], [place] [address]   . Unit Number 01/14/2018 [id number]   Final  . Blood Component Type 01/14/2018 RED CELLS,LR   Final  . Unit division 01/14/2018 00   Final  . Status of Unit 01/14/2018 ISSUED,FINAL   Final  . Transfusion Status 01/14/2018 OK TO TRANSFUSE   Final  . Crossmatch Result 01/14/2018 Compatible   Final  . MRSA, PCR 01/14/2018 POSITIVE* NEGATIVE Final   Comment: RESULT CALLED TO, READ BACK BY AND VERIFIED WITH: I EWURUM,RN [id number] @ [address] [place] [place]   . Staphylococcus aureus 01/14/2018 POSITIVE* NEGATIVE Final   Comment: (NOTE) The Xpert SA Assay (FDA approved for NASAL specimens in patients 25 years of age and older), is one component of a comprehensive surveillance program. It is not intended to diagnose infection nor to guide or monitor treatment. Performed at [facility], [place] [address], [place], [place] [address]   . ISSUE DATE / TIME 01/14/2018 [id number]   Final  . Blood Product Unit Number 01/14/2018 [id number]   Final  . PRODUCT CODE 01/14/2018 [id number]   Final  . Unit Type and Rh 01/14/2018 5100   Final  . Blood Product Expiration Date 01/14/2018 [id number]   Final  . ISSUE DATE / TIME 01/14/2018 [id number]   Final  . Blood Product Unit Number 01/14/2018 [id number]   Final  . PRODUCT CODE 01/14/2018 [id number]   Final  . Unit Type and Rh 01/14/2018 5100   Final  . Blood Product Expiration Date 01/14/2018 [id number]   Final     X-Rays:Dg Pelvis Portable  Result Date: 01/22/2018 CLINICAL DATA:  Status post left hip replacement EXAM: PORTABLE PELVIS 1-2 VIEWS COMPARISON:  10/09/2017 FINDINGS: New left hip prosthesis is noted. Surgical drain is noted in place. No acute bony abnormality is seen. Right hip replacement is noted as well. IMPRESSION: Status post left hip replacement Electronically Signed   By: Inez Catalina M.D.   On: 01/22/2018 15:18   Dg C-arm 1-60 Min-no Report  Result Date: 01/22/2018 Fluoroscopy was utilized by the requesting physician.  No radiographic interpretation.   Dg Hip Operative Unilat W Or W/o Pelvis Left  Result Date: 01/22/2018 CLINICAL DATA:  Total left hip replacement 8 seconds. Images: 8 EXAM: OPERATIVE LEFT HIP (WITH PELVIS IF PERFORMED) 8 VIEWS TECHNIQUE: Fluoroscopic spot image(s) were submitted for interpretation post-operatively. COMPARISON:  None. FINDINGS: Images were obtained during the patient's left hip replacement. By the end of the study, the patient is status post left hip replacement. The acetabular component is in good position as is the femoral component. A surgical drain is noted. IMPRESSION: Left hip replacement as above. Electronically Signed   By: Dorise Bullion III M.D   On: 01/22/2018 14:35    EKG: Orders placed or performed during the hospital encounter of 10/09/17  . EKG 12-Lead  . EKG 12-Lead      Hospital Course: KALIA VAHEY is a 63 y.o. who was admitted to [facility]. They were brought to the operating room on 01/22/2018 and underwent Procedure(s): Frank.  Patient tolerated the procedure well and was later transferred to the recovery room and then to the orthopaedic floor for postoperative care. They were given PO and IV analgesics for pain control following their surgery. They were given 24 hours of postoperative antibiotics of  Anti-infectives (From admission, onward)   Start     Dose/Rate Route Frequency Ordered  Stop   01/22/18 2000  ceFAZolin (ANCEF) IVPB 2g/100 mL premix     2 g 200 mL/hr over 30 Minutes Intravenous Every 6 hours 01/22/18 1508 01/23/18 0205   01/22/18 1115  ceFAZolin (ANCEF) IVPB 2g/100 mL premix     2 g 200 mL/hr over 30 Minutes Intravenous On call to O.R. 01/22/18 1110 01/22/18 1256   01/22/18 1115  vancomycin (VANCOCIN) IVPB 1000 mg/200 mL premix     1,000 mg 200 mL/hr over 60 Minutes Intravenous On call to O.R. 01/22/18 1110 01/22/18 1242     and started on DVT prophylaxis in the form of Aspirin.   PT and OT were ordered for total joint protocol. Discharge planning consulted to help with postop disposition and equipment needs. Patient had a decent night on the evening of surgery. They started to get up OOB with therapy on POD #1. Hemovac drain was pulled without difficulty on day one. Hemoglobin dropped from 12.1 pre-operatively to 7.1, 2 units of RBCs were transfused. Continued to work with therapy into POD #2. Pt was seen during rounds on day two and was ready to go home pending progress with therapy. Dressing was changed and the incision was clean, dry, and intact with no drainage. Hemoglobin was improved at 8.6. Patient was slower to progress with physical therapy and was not meeting her goals by the end of day 2. Patient continued to work with therapy over the weekend on post-operative days #3 and #4.  Patient was seen during rounds on POD #5 and she was ready to go home. Incision was healing well. She worked with therapy for one session and was discharged home later that day in stable condition.   Diet: Diabetic diet Activity: WBAT Follow-up: in 2 weeks Disposition: Home with HHPT Discharged Condition: stable   Discharge Instructions    Call MD / Call 911   Complete by:  As directed    If you experience chest pain or shortness of breath, CALL 911 and be transported to the hospital emergency room.  If you develope a fever above 101 F, pus (white drainage) or increased drainage or redness at the wound, or calf pain, call your surgeon's office.   Change dressing   Complete by:  As directed    You may change your dressing on Friday, then change the dressing daily with sterile 4 x 4 inch gauze dressing and paper tape.   Constipation Prevention   Complete by:  As directed    Drink plenty of fluids.  Prune juice may be helpful.  You may use a stool softener, such as Colace (over the counter) 100 mg twice a day.  Use MiraLax (over the counter) for constipation as needed.   Diet - low sodium heart healthy   Complete by:  As directed    Discharge instructions   Complete by:  As directed    Dr. Gaynelle Arabian Total Joint Specialist [facility] [address] [place], [place], [place] [address] [phone]  ANTERIOR APPROACH TOTAL HIP REPLACEMENT POSTOPERATIVE DIRECTIONS   Hip Rehabilitation, Guidelines Following Surgery  The results of a hip operation are greatly improved after range of motion and muscle strengthening exercises. Follow all safety measures which are given to protect your hip. If any of these exercises cause increased pain or swelling in your joint, decrease the amount until you are comfortable again. Then slowly increase the exercises. Call your caregiver if you have problems or questions.   HOME CARE INSTRUCTIONS  Remove items  at home which could result in a fall.  This includes throw rugs or furniture in walking pathways.  ICE to the affected hip every three hours for 30 minutes at a time and then as needed for pain and swelling.  Continue to use ice on the hip for pain and swelling from surgery. You may notice swelling that will progress down to the foot and ankle.  This is normal after surgery.  Elevate the leg when you are not up walking on it.   Continue to use the breathing machine which will help keep your temperature down.  It is common for your temperature to cycle up and down following surgery, especially at night when you are not up moving around and exerting yourself.  The breathing machine keeps your lungs expanded and your temperature down.  DIET You may resume your previous home diet once your are discharged from the hospital.  DRESSING / WOUND CARE / SHOWERING You may shower 3 days after surgery, but keep the wounds dry during showering.  You may use an occlusive plastic wrap (Press'n Seal for example), NO SOAKING/SUBMERGING IN THE BATHTUB.  If the bandage gets wet, change with a clean dry gauze.  If the incision gets wet, pat the wound dry with a clean towel. You may start showering once you are discharged home but do not submerge the incision under water. Just pat the incision dry and apply a dry gauze dressing on daily. Change the surgical dressing daily and reapply a dry dressing each time.  ACTIVITY Walk with your walker as instructed. Use walker as long as suggested by your caregivers. Avoid periods of inactivity such as sitting longer than an hour when not asleep. This helps prevent blood clots.  You may resume a sexual relationship in one month or when given the OK by your doctor.  You may return to work once you are cleared by your doctor.  Do not drive a car for 6 weeks or until released by you surgeon.  Do not drive while taking narcotics.  WEIGHT BEARING Weight bearing as tolerated with assist device (walker, cane, etc) as  directed, use it as long as suggested by your surgeon or therapist, typically at least 4-6 weeks.  POSTOPERATIVE CONSTIPATION PROTOCOL Constipation - defined medically as fewer than three stools per week and severe constipation as less than one stool per week.  One of the most common issues patients have following surgery is constipation.  Even if you have a regular bowel pattern at home, your normal regimen is likely to be disrupted due to multiple reasons following surgery.  Combination of anesthesia, postoperative narcotics, change in appetite and fluid intake all can affect your bowels.  In order to avoid complications following surgery, here are some recommendations in order to help you during your recovery period.  Colace (docusate) - Pick up an over-the-counter form of Colace or another stool softener and take twice a day as long as you are requiring postoperative pain medications.  Take with a full glass of water daily.  If you experience loose stools or diarrhea, hold the colace until you stool forms back up.  If your symptoms do not get better within 1 week or if they get worse, check with your doctor.  Dulcolax (bisacodyl) - Pick up over-the-counter and take as directed by the product packaging as needed to assist with the movement of your bowels.  Take with a full glass of water.  Use this product as needed if not  relieved by Colace only.   MiraLax (polyethylene glycol) - Pick up over-the-counter to have on hand.  MiraLax is a solution that will increase the amount of water in your bowels to assist with bowel movements.  Take as directed and can mix with a glass of water, juice, soda, coffee, or tea.  Take if you go more than two days without a movement. Do not use MiraLax more than once per day. Call your doctor if you are still constipated or irregular after using this medication for 7 days in a row.  If you continue to have problems with postoperative constipation, please contact the  office for further assistance and recommendations.  If you experience "the worst abdominal pain ever" or develop nausea or vomiting, please contact the office immediatly for further recommendations for treatment.  ITCHING  If you experience itching with your medications, try taking only a single pain pill, or even half a pain pill at a time.  You can also use Benadryl over the counter for itching or also to help with sleep.   TED HOSE STOCKINGS Wear the elastic stockings on both legs for three weeks following surgery during the day but you may remove then at night for sleeping.  MEDICATIONS See your medication summary on the "After Visit Summary" that the nursing staff will review with you prior to discharge.  You may have some home medications which will be placed on hold until you complete the course of blood thinner medication.  It is important for you to complete the blood thinner medication as prescribed by your surgeon.  Continue your approved medications as instructed at time of discharge.  PRECAUTIONS If you experience chest pain or shortness of breath - call 911 immediately for transfer to the hospital emergency department.  If you develop a fever greater that 101 F, purulent drainage from wound, increased redness or drainage from wound, foul odor from the wound/dressing, or calf pain - CONTACT YOUR SURGEON.                                                   FOLLOW-UP APPOINTMENTS Make sure you keep all of your appointments after your operation with your surgeon and caregivers. You should call the office at the above phone number and make an appointment for approximately two weeks after the date of your surgery or on the date instructed by your surgeon outlined in the "After Visit Summary".  RANGE OF MOTION AND STRENGTHENING EXERCISES  These exercises are designed to help you keep full movement of your hip joint. Follow your caregiver's or physical therapist's instructions. Perform all  exercises about fifteen times, three times per day or as directed. Exercise both hips, even if you have had only one joint replacement. These exercises can be done on a training (exercise) mat, on the floor, on a table or on a bed. Use whatever works the best and is most comfortable for you. Use music or television while you are exercising so that the exercises are a pleasant break in your day. This will make your life better with the exercises acting as a break in routine you can look forward to.  Lying on your back, slowly slide your foot toward your buttocks, raising your knee up off the floor. Then slowly slide your foot back down until your leg is straight again.  Lying on your back spread your legs as far apart as you can without causing discomfort.  Lying on your side, raise your upper leg and foot straight up from the floor as far as is comfortable. Slowly lower the leg and repeat.  Lying on your back, tighten up the muscle in the front of your thigh (quadriceps muscles). You can do this by keeping your leg straight and trying to raise your heel off the floor. This helps strengthen the largest muscle supporting your knee.  Lying on your back, tighten up the muscles of your buttocks both with the legs straight and with the knee bent at a comfortable angle while keeping your heel on the floor.   IF YOU ARE TRANSFERRED TO A SKILLED REHAB FACILITY If the patient is transferred to a skilled rehab facility following release from the hospital, a list of the current medications will be sent to the facility for the patient to continue.  When discharged from the skilled rehab facility, please have the facility set up the patient's [place] prior to being released. Also, the skilled facility will be responsible for providing the patient with their medications at time of release from the facility to include their pain medication, the muscle relaxants, and their blood thinner medication. If  the patient is still at the rehab facility at time of the two week follow up appointment, the skilled rehab facility will also need to assist the patient in arranging follow up appointment in our office and any transportation needs.  MAKE SURE YOU:  Understand these instructions.  Get help right away if you are not doing well or get worse.    Pick up stool softner and laxative for home use following surgery while on pain medications. Do not submerge incision under water. Please use good hand washing techniques while changing dressing each day. May shower starting three days after surgery. Please use a clean towel to pat the incision dry following showers. Continue to use ice for pain and swelling after surgery. Do not use any lotions or creams on the incision until instructed by your surgeon.   Do not sit on low chairs, stoools or toilet seats, as it may be difficult to get up from low surfaces   Complete by:  As directed    Driving restrictions   Complete by:  As directed    No driving for two weeks   TED hose   Complete by:  As directed    Use stockings (TED hose) for three weeks on both leg(s).  You may remove them at night for sleeping.   Weight bearing as tolerated   Complete by:  As directed      Allergies as of 01/27/2018      Reactions   Crestor [rosuvastatin] Other (See Comments)   myalgia   Lipitor [atorvastatin] Other (See Comments)   myalgia   Lisinopril Other (See Comments)   Worsens asthma   Zetia [ezetimibe] Other (See Comments)   Leg pains      Medication List    TAKE these medications   albuterol 108 (90 Base) MCG/ACT inhaler Commonly known as:  PROVENTIL HFA;VENTOLIN HFA Inhale 1-2 puffs into the lungs every 6 (six) hours as needed for wheezing or shortness of breath.   aspirin 325 MG EC tablet Take 1 tablet (325 mg total) by mouth 2 (two) times daily for 17 days. Then resume one 81 mg aspirin once a day. What changed:    medication strength  how  much to take  when to take this  additional instructions   Choline Fenofibrate 135 MG capsule Take 135 mg by mouth every morning.   HYDROcodone-acetaminophen 5-325 MG tablet Commonly known as:  NORCO/VICODIN Take 1-2 tablets by mouth every 6 (six) hours as needed for moderate pain (pain score 4-6).   losartan-hydrochlorothiazide 100-25 MG tablet Commonly known as:  HYZAAR Take 1 tablet by mouth every morning.   methocarbamol 500 MG tablet Commonly known as:  ROBAXIN Take 1 tablet (500 mg total) by mouth every 6 (six) hours as needed for muscle spasms.   multivitamin with minerals Tabs tablet Take 1 tablet by mouth daily.   RESTASIS 0.05 % ophthalmic emulsion Generic drug:  cycloSPORINE Place 1 drop into both eyes 2 (two) times daily.   traMADol 50 MG tablet Commonly known as:  ULTRAM Take 1-2 tablets (50-100 mg total) by mouth every 6 (six) hours as needed for moderate pain (unresponsive to Norco). What changed:    how much to take  when to take this  reasons to take this   VITAMIN C PO Take 1 tablet by mouth daily.            Discharge Care Instructions  (From admission, onward)         Start     Ordered   01/23/18 0000  Weight bearing as tolerated     01/23/18 0823   01/23/18 0000  Change dressing    Comments:  You may change your dressing on Friday, then change the dressing daily with sterile 4 x 4 inch gauze dressing and paper tape.   01/23/18 8295         Follow-up Information    Gaynelle Arabian, MD. Schedule an appointment as soon as possible for a visit on 02/06/2018.   Specialty:  Orthopedic Surgery Contact information: [address] [place] [address] [phone]        [facility] Follow up.   Specialty:  Home Health Services Why:  Home Health Physical Therapy and Occupational Therapy-agency will call to arrange initial visit Contact information: [place]  [address] [phone]           Signed: Theresa Duty, PA-C Orthopedic Surgery 01/31/2018, 3:01 PM

## 2018-03-14 ENCOUNTER — Ambulatory Visit: Payer: Medicare Other

## 2018-04-10 ENCOUNTER — Ambulatory Visit: Payer: Medicare Other

## 2018-05-23 ENCOUNTER — Other Ambulatory Visit: Payer: Self-pay

## 2018-05-23 ENCOUNTER — Ambulatory Visit
Admission: RE | Admit: 2018-05-23 | Discharge: 2018-05-23 | Disposition: A | Discharge status: Home / Self Care | Payer: Medicare Other | Source: Ambulatory Visit | Attending: Family Medicine | Admitting: Family Medicine

## 2018-05-23 DIAGNOSIS — Z1231 Encounter for screening mammogram for malignant neoplasm of breast: Secondary | ICD-10-CM

## 2018-06-11 ENCOUNTER — Other Ambulatory Visit: Payer: Self-pay | Admitting: Family Medicine

## 2018-06-11 DIAGNOSIS — E2839 Other primary ovarian failure: Secondary | ICD-10-CM

## 2018-07-17 ENCOUNTER — Encounter: Payer: Self-pay | Admitting: Podiatry

## 2018-07-17 ENCOUNTER — Telehealth: Payer: Self-pay | Admitting: Podiatry

## 2018-07-17 ENCOUNTER — Ambulatory Visit: Payer: Medicare Other | Admitting: Podiatry

## 2018-07-17 ENCOUNTER — Other Ambulatory Visit: Payer: Self-pay

## 2018-07-17 VITALS — BP 129/72 | HR 80 | Temp 98.6°F | Resp 16

## 2018-07-17 DIAGNOSIS — L600 Ingrowing nail: Secondary | ICD-10-CM | POA: Diagnosis not present

## 2018-07-17 DIAGNOSIS — Q828 Other specified congenital malformations of skin: Secondary | ICD-10-CM

## 2018-07-17 MED ORDER — NEOMYCIN-POLYMYXIN-HC 1 % OT SOLN: OTIC | 1 refills | Status: DC

## 2018-07-17 NOTE — Patient Instructions (Signed)

## 2018-07-17 NOTE — Telephone Encounter (Signed)
Pt called saying toe was bleeding and wanted to know when to start the soaks. I told pt to reinforce the dressing with gauze and to stay off her foot as much as possible and keep elevated. I instructed her to start the betadine soaks tomorrow and do the soaks for 20 minutes twice a day. I also instructed her to follow the soaking instructions.

## 2018-07-19 NOTE — Progress Notes (Signed)
Subjective:  Patient ID: Tamara Stokes, female    DOB: 1955/12/27,  MRN: [id number] HPI Chief Complaint  Patient presents with  . Nail Problem    Hallux nail right - thick and dark, wants the whole nail removed  . Callouses    Trim callus plantar forefoot right   . Diabetes    Last a1c was 5.7 - took off diabetic meds  . New Patient (Initial Visit)    Est pt 53    63 y.o. female presents with the above complaint.   ROS: She denies fever chills nausea vomiting muscle aches pains calf pain back pain chest pain shortness of breath.  Past Medical History:  Diagnosis Date  . Asthma    prn inhaler  . Bilateral dry eyes   . Bilateral hip pain   . DDD (degenerative disc disease), lumbar   . History of PID 2007   tubo-ovarian abscess  . Hypertension    followed by pcp  . Leg weakness, bilateral    due to OA bilateral hips  . Mixed hyperlipidemia   . OA (osteoarthritis)    knees  . OA (osteoarthritis) of hip    RIGHT  . OSA on CPAP    per study in epic 11/ 2005 very severe osa (per pt uses every night)  . Osteopenia   . Type 2 diabetes mellitus ([place])    followed by pcp  . Wears glasses    Past Surgical History:  Procedure Laterality Date  . APPENDECTOMY  1983  . CARPAL TUNNEL RELEASE Bilateral right 01/ 2002;  left 01-26-1999 dr Percell Miller  . COLONOSCOPY  last one 2018  . D & C HYSTEROSCOPY W/ RESECTION POLYPS  04-25-2009   dr stringer  @ [facility]  . KNEE ARTHROSCOPY Right 06-13-2006  dr Percell Miller @[facility]   . KNEE ARTHROSCOPY W/ LATERAL RELEASE Right   . Left total hip arthroplasty     Dr. Wynelle Link 01-22-18   . MULTIPLE TOOTH EXTRACTIONS    . THUMB A-1 PULLEY RELEASE Left 08-14-2007   dr Percell Miller  . TOTAL HIP ARTHROPLASTY Right 10/09/2017   Procedure: RIGHT TOTAL HIP ARTHROPLASTY ANTERIOR APPROACH;  Surgeon: Gaynelle Arabian, MD;  Location: [facility];  Service: Orthopedics;  Laterality: Right;  . TOTAL HIP ARTHROPLASTY Left 01/22/2018   Procedure: TOTAL HIP ARTHROPLASTY ANTERIOR  APPROACH;  Surgeon: Gaynelle Arabian, MD;  Location: [facility];  Service: Orthopedics;  Laterality: Left;  111min  . UMBILICAL HERNIA REPAIR  1981    Current Outpatient Medications:  .  albuterol (PROVENTIL HFA;VENTOLIN HFA) 108 (90 Base) MCG/ACT inhaler, Inhale 1-2 puffs into the lungs every 6 (six) hours as needed for wheezing or shortness of breath. , Disp: , Rfl:  .  Ascorbic Acid (VITAMIN C PO), Take 1 tablet by mouth daily., Disp: , Rfl:  .  Choline Fenofibrate 135 MG capsule, Take 135 mg by mouth every morning. , Disp: , Rfl:  .  losartan-hydrochlorothiazide (HYZAAR) 100-25 MG tablet, Take 1 tablet by mouth every morning. , Disp: , Rfl:  .  Multiple Vitamin (MULTIVITAMIN WITH MINERALS) TABS tablet, Take 1 tablet by mouth daily., Disp: , Rfl:  .  NEOMYCIN-POLYMYXIN-HYDROCORTISONE (CORTISPORIN) 1 % SOLN OTIC solution, Apply 1-2 drops to toe BID after soaking, Disp: 10 mL, Rfl: 1 .  RESTASIS 0.05 % ophthalmic emulsion, Place 1 drop into both eyes 2 (two) times daily. , Disp: , Rfl:   Allergies  Allergen Reactions  . Crestor [Rosuvastatin] Other (See Comments)    myalgia  .  Lipitor [Atorvastatin] Other (See Comments)    myalgia  . Lisinopril Other (See Comments)    Worsens asthma  . Zetia [Ezetimibe] Other (See Comments)    Leg pains   Review of Systems Objective:   Vitals:   07/17/18 1033  BP: 129/72  Pulse: 80  Resp: 16  Temp: 98.6 F (37 C)    General: Well developed, nourished, in no acute distress, alert and oriented x3   Dermatological: Skin is warm, dry and supple bilateral. Nails x 10 are well maintained; remaining integument appears unremarkable at this time. There are no open sores, no preulcerative lesions, no rash or signs of infection present.  Vascular: Dorsalis Pedis artery and Posterior Tibial artery pedal pulses are 2/4 bilateral with immedate capillary fill time. Pedal hair growth present. No varicosities and no lower extremity edema present bilateral.    Neruologic: Grossly intact via light touch bilateral. Vibratory intact via tuning fork bilateral. Protective threshold with Semmes Wienstein monofilament intact to all pedal sites bilateral. Patellar and Achilles deep tendon reflexes 2+ bilateral. No Babinski or clonus noted bilateral.   Musculoskeletal: No gross boney pedal deformities bilateral. No pain, crepitus, or limitation noted with foot and ankle range of motion bilateral. Muscular strength 5/5 in all groups tested bilateral.  Gait: Unassisted, Nonantalgic.    Radiographs:  None taken  Assessment & Plan:   Assessment: Ingrown toenail hallux right, porokeratosis plantar aspect right foot  Plan: Debrided reactive hyperkeratotic lesion plantar aspect of the right foot also performed a total matrixectomy hallux right this was performed after local anesthetic was administered she tolerated procedure well.  Was provided with both oral and written home-going instructions for the care and soaking of the toe as well as a prescription for Cortisporin Otic to be applied twice daily after soaking.  I will follow-up with her in 2 weeks to make sure she is healing well.     Max T. [place], [place]

## 2018-07-31 ENCOUNTER — Ambulatory Visit (INDEPENDENT_AMBULATORY_CARE_PROVIDER_SITE_OTHER): Payer: Medicare Other | Admitting: Podiatry

## 2018-07-31 ENCOUNTER — Other Ambulatory Visit: Payer: Self-pay

## 2018-07-31 ENCOUNTER — Encounter: Payer: Self-pay | Admitting: Podiatry

## 2018-07-31 VITALS — Temp 98.2°F

## 2018-07-31 DIAGNOSIS — L600 Ingrowing nail: Secondary | ICD-10-CM

## 2018-07-31 DIAGNOSIS — Z9889 Other specified postprocedural states: Secondary | ICD-10-CM

## 2018-07-31 NOTE — Patient Instructions (Signed)

## 2018-07-31 NOTE — Progress Notes (Signed)
She presents today for follow-up of a total nail avulsion matrixectomy hallux right she states that is doing good she denies fever chills nausea vomiting muscle aches pains continues to soak daily.  Objective: Vital signs are stable she is alert and oriented x3 nice granular base to the nail bed which is epithelializing.  Assessment: Well-healing nail avulsion hallux right.  Plan: Redressed today with a dry sterile compressive dressing and continue to soak daily to every other day cover during the day but leave open at bedtime.  If this is not going to heal up she will notify me in a couple weeks.  Otherwise we will follow-up with her in 2 to 3 weeks to trim the porokeratotic lesion to the plantar aspect of the forefoot left and trim toenails.

## 2018-08-18 ENCOUNTER — Telehealth: Payer: Self-pay | Admitting: Podiatry

## 2018-08-18 NOTE — Telephone Encounter (Signed)
Pt had her toenail removed on 7/16 and the toe seemed to be healing well but has taken a turn for the worse. Pt states the toe looks like it might be infected and would like to know what she can do. Please give patient a call.

## 2018-08-18 NOTE — Telephone Encounter (Signed)
Pt is scheduled to see Dr. Jacqualyn Posey tomorrow morning at 8:45 am.

## 2018-08-18 NOTE — Telephone Encounter (Addendum)
I called pt and she states the toe is puffy and "hurts like Hell" and has drainage. Pt states she is soaking in epsom salt and covering with an antibiotic ointment dressing and cleaning with peroxide. I offered pt an appt and she states that is what she wanted. Transferred to scheduler.

## 2018-08-19 ENCOUNTER — Other Ambulatory Visit: Payer: Self-pay

## 2018-08-19 ENCOUNTER — Encounter: Payer: Self-pay | Admitting: Podiatry

## 2018-08-19 ENCOUNTER — Ambulatory Visit (INDEPENDENT_AMBULATORY_CARE_PROVIDER_SITE_OTHER): Payer: Medicare Other | Admitting: Podiatry

## 2018-08-19 VITALS — Temp 97.9°F

## 2018-08-19 DIAGNOSIS — L97511 Non-pressure chronic ulcer of other part of right foot limited to breakdown of skin: Secondary | ICD-10-CM | POA: Diagnosis not present

## 2018-08-19 MED ORDER — CEPHALEXIN 500 MG PO CAPS: 500.0000 mg | ORAL_CAPSULE | Freq: Three times a day (TID) | ORAL | 0 refills | Status: DC

## 2018-08-19 MED ORDER — MUPIROCIN 2 % EX OINT: 1.0000 "application " | TOPICAL_OINTMENT | Freq: Two times a day (BID) | CUTANEOUS | 2 refills | Status: DC

## 2018-08-19 NOTE — Patient Instructions (Signed)

## 2018-08-25 NOTE — Progress Notes (Signed)
Subjective: 63 year old female presents the office today for concerns of pain to the right big toe and she is had some drainage over the last week and she feels the toe was swollen.  She previously underwent total nail avulsion with Dr. Milinda Pointer last month.  She has come back and soaking in Epsom salts as well as cleaning with peroxide.  Keeping Neosporin on the area.  No recent injury.  She is diabetic and her last A1c was 5.5. Denies any systemic complaints such as fevers, chills, nausea, vomiting. No acute changes since last appointment, and no other complaints at this time.   Objective: AAO x3, NAD DP/PT pulses palpable 2/4 bilaterally, CRT less than 3 seconds There is a granular wound present on the right hallux nail bed with some fibrotic tissue.  Not able to identify any drainage or pus.  Mild edema around the nailbed there is no erythema or warmth.  No ascending cellulitis.  No open lesions or pre-ulcerative lesions.  No pain with calf compression, swelling, warmth, erythema  Assessment: Right hallux wound  Plan: -All treatment options discussed with the patient including all alternatives, risks, complications.  -Today I did debride the area lightly without any complications.  Wonders about using Epson salt soaks daily as well as Antibiotic Ointment on the Wound Daily.  Prescribed Mupirocin Ointment.  -Keflex -Monitor for any clinical signs or symptoms of infection and directed to call the office immediately should any occur or go to the ER. -Patient encouraged to call the office with any questions, concerns, change in symptoms.   Follow-up with Dr. Milinda Pointer as scheduled.  Trula Slade DPM

## 2018-08-26 ENCOUNTER — Ambulatory Visit
Admission: RE | Admit: 2018-08-26 | Discharge: 2018-08-26 | Disposition: A | Discharge status: Home / Self Care | Payer: Medicare Other | Source: Ambulatory Visit | Attending: Family Medicine | Admitting: Family Medicine

## 2018-08-26 ENCOUNTER — Other Ambulatory Visit: Payer: Self-pay

## 2018-08-26 DIAGNOSIS — E2839 Other primary ovarian failure: Secondary | ICD-10-CM

## 2018-08-28 ENCOUNTER — Other Ambulatory Visit: Payer: Self-pay

## 2018-08-28 ENCOUNTER — Encounter: Payer: Self-pay | Admitting: Podiatry

## 2018-08-28 ENCOUNTER — Ambulatory Visit (INDEPENDENT_AMBULATORY_CARE_PROVIDER_SITE_OTHER): Payer: Medicare Other | Admitting: Podiatry

## 2018-08-28 DIAGNOSIS — L03031 Cellulitis of right toe: Secondary | ICD-10-CM | POA: Diagnosis not present

## 2018-08-30 ENCOUNTER — Encounter: Payer: Self-pay | Admitting: Podiatry

## 2018-08-30 NOTE — Progress Notes (Signed)
She presents today for follow-up of her paronychia of her hallux states that is doing much better I have no problems with it whatsoever.  Objective: Vital signs are stable alert and oriented x3.  Pulses are palpable.  There is no erythema edema cellulitis drainage odor appears to be doing very nicely I clean the area out today I sees no signs of infection no bleeding.  Assessment: Well-healing surgical toe hallux right.  Plan: Continue conservative therapies follow-up with me.

## 2018-09-11 ENCOUNTER — Ambulatory Visit: Payer: Medicare Other

## 2018-09-11 ENCOUNTER — Other Ambulatory Visit: Payer: Self-pay

## 2018-09-11 DIAGNOSIS — L03031 Cellulitis of right toe: Secondary | ICD-10-CM

## 2018-10-03 NOTE — Progress Notes (Signed)
Patient is here today for follow-up appointment, recent procedure performed on 08/28/2018, removal of ingrown toenail.  She states that overall the area is healing well and she is ongoing problems at this time.  No redness, no swelling, no erythema, no drainage, no other signs symptoms of infection.  Area is scabbed over and appears to be healing well.  Discussed signs and symptoms of infection with patient, verbal and written instructions were given.  She is to follow-up as needed with any acute symptom changes.

## 2019-05-07 DIAGNOSIS — M79642 Pain in left hand: Secondary | ICD-10-CM | POA: Insufficient documentation

## 2019-05-07 DIAGNOSIS — M659 Synovitis and tenosynovitis, unspecified: Secondary | ICD-10-CM | POA: Insufficient documentation

## 2019-06-03 ENCOUNTER — Other Ambulatory Visit: Payer: Self-pay | Admitting: Family Medicine

## 2019-06-03 DIAGNOSIS — Z1231 Encounter for screening mammogram for malignant neoplasm of breast: Secondary | ICD-10-CM

## 2019-06-10 ENCOUNTER — Ambulatory Visit
Admission: RE | Admit: 2019-06-10 | Discharge: 2019-06-10 | Disposition: A | Discharge status: Home / Self Care | Payer: Medicare Other | Source: Ambulatory Visit | Attending: Family Medicine | Admitting: Family Medicine

## 2019-06-10 ENCOUNTER — Other Ambulatory Visit: Payer: Self-pay

## 2019-06-10 DIAGNOSIS — Z1231 Encounter for screening mammogram for malignant neoplasm of breast: Secondary | ICD-10-CM

## 2020-01-19 DIAGNOSIS — J45909 Unspecified asthma, uncomplicated: Secondary | ICD-10-CM | POA: Diagnosis not present

## 2020-01-19 DIAGNOSIS — E1122 Type 2 diabetes mellitus with diabetic chronic kidney disease: Secondary | ICD-10-CM | POA: Diagnosis not present

## 2020-01-19 DIAGNOSIS — Z23 Encounter for immunization: Secondary | ICD-10-CM | POA: Diagnosis not present

## 2020-01-19 DIAGNOSIS — G720 Drug-induced myopathy: Secondary | ICD-10-CM | POA: Diagnosis not present

## 2020-01-19 DIAGNOSIS — I10 Essential (primary) hypertension: Secondary | ICD-10-CM | POA: Diagnosis not present

## 2020-01-19 DIAGNOSIS — N182 Chronic kidney disease, stage 2 (mild): Secondary | ICD-10-CM | POA: Diagnosis not present

## 2020-01-19 DIAGNOSIS — E1169 Type 2 diabetes mellitus with other specified complication: Secondary | ICD-10-CM | POA: Diagnosis not present

## 2020-01-19 DIAGNOSIS — Z7984 Long term (current) use of oral hypoglycemic drugs: Secondary | ICD-10-CM | POA: Diagnosis not present

## 2020-01-19 DIAGNOSIS — E782 Mixed hyperlipidemia: Secondary | ICD-10-CM | POA: Diagnosis not present

## 2020-02-01 DIAGNOSIS — E1169 Type 2 diabetes mellitus with other specified complication: Secondary | ICD-10-CM | POA: Diagnosis not present

## 2020-02-02 DIAGNOSIS — R5381 Other malaise: Secondary | ICD-10-CM | POA: Diagnosis not present

## 2020-02-02 DIAGNOSIS — E119 Type 2 diabetes mellitus without complications: Secondary | ICD-10-CM | POA: Diagnosis not present

## 2020-02-02 DIAGNOSIS — E559 Vitamin D deficiency, unspecified: Secondary | ICD-10-CM | POA: Diagnosis not present

## 2020-02-02 DIAGNOSIS — E7800 Pure hypercholesterolemia, unspecified: Secondary | ICD-10-CM | POA: Diagnosis not present

## 2020-02-02 DIAGNOSIS — R0602 Shortness of breath: Secondary | ICD-10-CM | POA: Diagnosis not present

## 2020-02-11 DIAGNOSIS — I10 Essential (primary) hypertension: Secondary | ICD-10-CM | POA: Diagnosis not present

## 2020-02-11 DIAGNOSIS — E1169 Type 2 diabetes mellitus with other specified complication: Secondary | ICD-10-CM | POA: Diagnosis not present

## 2020-02-11 DIAGNOSIS — E782 Mixed hyperlipidemia: Secondary | ICD-10-CM | POA: Diagnosis not present

## 2020-02-11 DIAGNOSIS — J45909 Unspecified asthma, uncomplicated: Secondary | ICD-10-CM | POA: Diagnosis not present

## 2020-02-11 DIAGNOSIS — N182 Chronic kidney disease, stage 2 (mild): Secondary | ICD-10-CM | POA: Diagnosis not present

## 2020-02-11 DIAGNOSIS — E1165 Type 2 diabetes mellitus with hyperglycemia: Secondary | ICD-10-CM | POA: Diagnosis not present

## 2020-02-11 DIAGNOSIS — H35033 Hypertensive retinopathy, bilateral: Secondary | ICD-10-CM | POA: Diagnosis not present

## 2020-02-11 DIAGNOSIS — E1122 Type 2 diabetes mellitus with diabetic chronic kidney disease: Secondary | ICD-10-CM | POA: Diagnosis not present

## 2020-03-01 DIAGNOSIS — M47817 Spondylosis without myelopathy or radiculopathy, lumbosacral region: Secondary | ICD-10-CM | POA: Diagnosis not present

## 2020-03-01 DIAGNOSIS — H35033 Hypertensive retinopathy, bilateral: Secondary | ICD-10-CM | POA: Diagnosis not present

## 2020-03-01 DIAGNOSIS — I10 Essential (primary) hypertension: Secondary | ICD-10-CM | POA: Diagnosis not present

## 2020-03-01 DIAGNOSIS — E782 Mixed hyperlipidemia: Secondary | ICD-10-CM | POA: Diagnosis not present

## 2020-03-01 DIAGNOSIS — E559 Vitamin D deficiency, unspecified: Secondary | ICD-10-CM | POA: Diagnosis not present

## 2020-03-01 DIAGNOSIS — N182 Chronic kidney disease, stage 2 (mild): Secondary | ICD-10-CM | POA: Diagnosis not present

## 2020-03-01 DIAGNOSIS — E1122 Type 2 diabetes mellitus with diabetic chronic kidney disease: Secondary | ICD-10-CM | POA: Diagnosis not present

## 2020-03-01 DIAGNOSIS — E1169 Type 2 diabetes mellitus with other specified complication: Secondary | ICD-10-CM | POA: Diagnosis not present

## 2020-03-01 DIAGNOSIS — E1165 Type 2 diabetes mellitus with hyperglycemia: Secondary | ICD-10-CM | POA: Diagnosis not present

## 2020-03-01 DIAGNOSIS — J45909 Unspecified asthma, uncomplicated: Secondary | ICD-10-CM | POA: Diagnosis not present

## 2020-03-01 DIAGNOSIS — E7800 Pure hypercholesterolemia, unspecified: Secondary | ICD-10-CM | POA: Diagnosis not present

## 2020-03-16 DIAGNOSIS — G4733 Obstructive sleep apnea (adult) (pediatric): Secondary | ICD-10-CM | POA: Diagnosis not present

## 2020-04-01 DIAGNOSIS — E7800 Pure hypercholesterolemia, unspecified: Secondary | ICD-10-CM | POA: Diagnosis not present

## 2020-04-01 DIAGNOSIS — E559 Vitamin D deficiency, unspecified: Secondary | ICD-10-CM | POA: Diagnosis not present

## 2020-04-25 DIAGNOSIS — I10 Essential (primary) hypertension: Secondary | ICD-10-CM | POA: Diagnosis not present

## 2020-04-25 DIAGNOSIS — N182 Chronic kidney disease, stage 2 (mild): Secondary | ICD-10-CM | POA: Diagnosis not present

## 2020-04-25 DIAGNOSIS — E1122 Type 2 diabetes mellitus with diabetic chronic kidney disease: Secondary | ICD-10-CM | POA: Diagnosis not present

## 2020-04-25 DIAGNOSIS — E782 Mixed hyperlipidemia: Secondary | ICD-10-CM | POA: Diagnosis not present

## 2020-04-25 DIAGNOSIS — E1169 Type 2 diabetes mellitus with other specified complication: Secondary | ICD-10-CM | POA: Diagnosis not present

## 2020-04-25 DIAGNOSIS — E1165 Type 2 diabetes mellitus with hyperglycemia: Secondary | ICD-10-CM | POA: Diagnosis not present

## 2020-04-25 DIAGNOSIS — J45909 Unspecified asthma, uncomplicated: Secondary | ICD-10-CM | POA: Diagnosis not present

## 2020-04-25 DIAGNOSIS — H35033 Hypertensive retinopathy, bilateral: Secondary | ICD-10-CM | POA: Diagnosis not present

## 2020-04-28 DIAGNOSIS — E1122 Type 2 diabetes mellitus with diabetic chronic kidney disease: Secondary | ICD-10-CM | POA: Diagnosis not present

## 2020-05-01 DIAGNOSIS — E559 Vitamin D deficiency, unspecified: Secondary | ICD-10-CM | POA: Diagnosis not present

## 2020-05-01 DIAGNOSIS — E119 Type 2 diabetes mellitus without complications: Secondary | ICD-10-CM | POA: Diagnosis not present

## 2020-05-11 DIAGNOSIS — I10 Essential (primary) hypertension: Secondary | ICD-10-CM | POA: Diagnosis not present

## 2020-05-11 DIAGNOSIS — J45909 Unspecified asthma, uncomplicated: Secondary | ICD-10-CM | POA: Diagnosis not present

## 2020-05-11 DIAGNOSIS — E1169 Type 2 diabetes mellitus with other specified complication: Secondary | ICD-10-CM | POA: Diagnosis not present

## 2020-05-11 DIAGNOSIS — M47817 Spondylosis without myelopathy or radiculopathy, lumbosacral region: Secondary | ICD-10-CM | POA: Diagnosis not present

## 2020-05-11 DIAGNOSIS — N182 Chronic kidney disease, stage 2 (mild): Secondary | ICD-10-CM | POA: Diagnosis not present

## 2020-05-11 DIAGNOSIS — E782 Mixed hyperlipidemia: Secondary | ICD-10-CM | POA: Diagnosis not present

## 2020-05-11 DIAGNOSIS — E1122 Type 2 diabetes mellitus with diabetic chronic kidney disease: Secondary | ICD-10-CM | POA: Diagnosis not present

## 2020-05-11 DIAGNOSIS — H35033 Hypertensive retinopathy, bilateral: Secondary | ICD-10-CM | POA: Diagnosis not present

## 2020-05-11 DIAGNOSIS — E1165 Type 2 diabetes mellitus with hyperglycemia: Secondary | ICD-10-CM | POA: Diagnosis not present

## 2020-05-31 DIAGNOSIS — E1122 Type 2 diabetes mellitus with diabetic chronic kidney disease: Secondary | ICD-10-CM | POA: Diagnosis not present

## 2020-06-08 DIAGNOSIS — E119 Type 2 diabetes mellitus without complications: Secondary | ICD-10-CM | POA: Diagnosis not present

## 2020-06-08 DIAGNOSIS — E559 Vitamin D deficiency, unspecified: Secondary | ICD-10-CM | POA: Diagnosis not present

## 2020-06-17 ENCOUNTER — Other Ambulatory Visit: Payer: Self-pay | Admitting: Family Medicine

## 2020-06-17 DIAGNOSIS — Z1231 Encounter for screening mammogram for malignant neoplasm of breast: Secondary | ICD-10-CM

## 2020-06-28 ENCOUNTER — Ambulatory Visit
Admission: RE | Admit: 2020-06-28 | Discharge: 2020-06-28 | Disposition: A | Discharge status: Home / Self Care | Payer: Medicare Other | Source: Ambulatory Visit | Attending: Family Medicine | Admitting: Family Medicine

## 2020-06-28 ENCOUNTER — Other Ambulatory Visit: Payer: Self-pay

## 2020-06-28 DIAGNOSIS — Z1231 Encounter for screening mammogram for malignant neoplasm of breast: Secondary | ICD-10-CM

## 2020-06-29 DIAGNOSIS — M47817 Spondylosis without myelopathy or radiculopathy, lumbosacral region: Secondary | ICD-10-CM | POA: Diagnosis not present

## 2020-06-29 DIAGNOSIS — E1165 Type 2 diabetes mellitus with hyperglycemia: Secondary | ICD-10-CM | POA: Diagnosis not present

## 2020-06-29 DIAGNOSIS — H35033 Hypertensive retinopathy, bilateral: Secondary | ICD-10-CM | POA: Diagnosis not present

## 2020-06-29 DIAGNOSIS — E1122 Type 2 diabetes mellitus with diabetic chronic kidney disease: Secondary | ICD-10-CM | POA: Diagnosis not present

## 2020-06-29 DIAGNOSIS — E782 Mixed hyperlipidemia: Secondary | ICD-10-CM | POA: Diagnosis not present

## 2020-06-29 DIAGNOSIS — N182 Chronic kidney disease, stage 2 (mild): Secondary | ICD-10-CM | POA: Diagnosis not present

## 2020-06-29 DIAGNOSIS — E1169 Type 2 diabetes mellitus with other specified complication: Secondary | ICD-10-CM | POA: Diagnosis not present

## 2020-06-29 DIAGNOSIS — I10 Essential (primary) hypertension: Secondary | ICD-10-CM | POA: Diagnosis not present

## 2020-06-29 DIAGNOSIS — J45909 Unspecified asthma, uncomplicated: Secondary | ICD-10-CM | POA: Diagnosis not present

## 2020-07-08 DIAGNOSIS — E559 Vitamin D deficiency, unspecified: Secondary | ICD-10-CM | POA: Diagnosis not present

## 2020-07-08 DIAGNOSIS — Z79899 Other long term (current) drug therapy: Secondary | ICD-10-CM | POA: Diagnosis not present

## 2020-07-08 DIAGNOSIS — E119 Type 2 diabetes mellitus without complications: Secondary | ICD-10-CM | POA: Diagnosis not present

## 2020-07-08 DIAGNOSIS — E782 Mixed hyperlipidemia: Secondary | ICD-10-CM | POA: Diagnosis not present

## 2020-07-08 DIAGNOSIS — Z1159 Encounter for screening for other viral diseases: Secondary | ICD-10-CM | POA: Diagnosis not present

## 2020-07-29 DIAGNOSIS — E1122 Type 2 diabetes mellitus with diabetic chronic kidney disease: Secondary | ICD-10-CM | POA: Diagnosis not present

## 2020-08-08 DIAGNOSIS — E119 Type 2 diabetes mellitus without complications: Secondary | ICD-10-CM | POA: Diagnosis not present

## 2020-08-16 ENCOUNTER — Encounter: Payer: Self-pay | Admitting: Podiatry

## 2020-08-16 ENCOUNTER — Ambulatory Visit: Payer: Medicare Other | Admitting: Podiatry

## 2020-08-16 ENCOUNTER — Other Ambulatory Visit: Payer: Self-pay

## 2020-08-16 DIAGNOSIS — D2371 Other benign neoplasm of skin of right lower limb, including hip: Secondary | ICD-10-CM | POA: Diagnosis not present

## 2020-08-16 DIAGNOSIS — E1165 Type 2 diabetes mellitus with hyperglycemia: Secondary | ICD-10-CM | POA: Insufficient documentation

## 2020-08-16 DIAGNOSIS — E1169 Type 2 diabetes mellitus with other specified complication: Secondary | ICD-10-CM | POA: Insufficient documentation

## 2020-08-16 DIAGNOSIS — B351 Tinea unguium: Secondary | ICD-10-CM | POA: Diagnosis not present

## 2020-08-16 DIAGNOSIS — I10 Essential (primary) hypertension: Secondary | ICD-10-CM | POA: Insufficient documentation

## 2020-08-16 DIAGNOSIS — M5416 Radiculopathy, lumbar region: Secondary | ICD-10-CM | POA: Insufficient documentation

## 2020-08-16 DIAGNOSIS — E782 Mixed hyperlipidemia: Secondary | ICD-10-CM | POA: Insufficient documentation

## 2020-08-16 DIAGNOSIS — J45909 Unspecified asthma, uncomplicated: Secondary | ICD-10-CM | POA: Insufficient documentation

## 2020-08-16 DIAGNOSIS — G720 Drug-induced myopathy: Secondary | ICD-10-CM | POA: Insufficient documentation

## 2020-08-16 DIAGNOSIS — N182 Chronic kidney disease, stage 2 (mild): Secondary | ICD-10-CM | POA: Insufficient documentation

## 2020-08-16 DIAGNOSIS — M5136 Other intervertebral disc degeneration, lumbar region: Secondary | ICD-10-CM | POA: Insufficient documentation

## 2020-08-16 DIAGNOSIS — M8588 Other specified disorders of bone density and structure, other site: Secondary | ICD-10-CM | POA: Insufficient documentation

## 2020-08-16 DIAGNOSIS — M79676 Pain in unspecified toe(s): Secondary | ICD-10-CM

## 2020-08-16 DIAGNOSIS — H35039 Hypertensive retinopathy, unspecified eye: Secondary | ICD-10-CM | POA: Insufficient documentation

## 2020-08-16 DIAGNOSIS — Z720 Tobacco use: Secondary | ICD-10-CM | POA: Insufficient documentation

## 2020-08-16 DIAGNOSIS — M47817 Spondylosis without myelopathy or radiculopathy, lumbosacral region: Secondary | ICD-10-CM | POA: Insufficient documentation

## 2020-08-16 DIAGNOSIS — E1121 Type 2 diabetes mellitus with diabetic nephropathy: Secondary | ICD-10-CM | POA: Insufficient documentation

## 2020-08-17 NOTE — Progress Notes (Signed)
She presents today chief complaint of ingrown toenails and calluses bilaterally.  Objective: Multiple reactive benign skin lesions and painfully elongated toenails.  Assessment: Pain in limb secondary to onychomycosis and benign skin lesions.  Plan: Debridement of benign skin lesions and debridement of toenails bilaterally.  Type 2 diabetes.

## 2020-08-30 DIAGNOSIS — E1169 Type 2 diabetes mellitus with other specified complication: Secondary | ICD-10-CM | POA: Diagnosis not present

## 2020-08-31 DIAGNOSIS — R319 Hematuria, unspecified: Secondary | ICD-10-CM | POA: Diagnosis not present

## 2020-09-06 DIAGNOSIS — E1122 Type 2 diabetes mellitus with diabetic chronic kidney disease: Secondary | ICD-10-CM | POA: Diagnosis not present

## 2020-09-06 DIAGNOSIS — N182 Chronic kidney disease, stage 2 (mild): Secondary | ICD-10-CM | POA: Diagnosis not present

## 2020-09-06 DIAGNOSIS — M47817 Spondylosis without myelopathy or radiculopathy, lumbosacral region: Secondary | ICD-10-CM | POA: Diagnosis not present

## 2020-09-06 DIAGNOSIS — E1169 Type 2 diabetes mellitus with other specified complication: Secondary | ICD-10-CM | POA: Diagnosis not present

## 2020-09-06 DIAGNOSIS — I10 Essential (primary) hypertension: Secondary | ICD-10-CM | POA: Diagnosis not present

## 2020-09-06 DIAGNOSIS — E782 Mixed hyperlipidemia: Secondary | ICD-10-CM | POA: Diagnosis not present

## 2020-09-06 DIAGNOSIS — H35033 Hypertensive retinopathy, bilateral: Secondary | ICD-10-CM | POA: Diagnosis not present

## 2020-09-06 DIAGNOSIS — J45909 Unspecified asthma, uncomplicated: Secondary | ICD-10-CM | POA: Diagnosis not present

## 2020-09-06 DIAGNOSIS — E1165 Type 2 diabetes mellitus with hyperglycemia: Secondary | ICD-10-CM | POA: Diagnosis not present

## 2020-09-09 DIAGNOSIS — Z79899 Other long term (current) drug therapy: Secondary | ICD-10-CM | POA: Diagnosis not present

## 2020-09-09 DIAGNOSIS — Z9181 History of falling: Secondary | ICD-10-CM | POA: Diagnosis not present

## 2020-09-09 DIAGNOSIS — E7800 Pure hypercholesterolemia, unspecified: Secondary | ICD-10-CM | POA: Diagnosis not present

## 2020-09-09 DIAGNOSIS — Z0000 Encounter for general adult medical examination without abnormal findings: Secondary | ICD-10-CM | POA: Diagnosis not present

## 2020-09-09 DIAGNOSIS — Z1159 Encounter for screening for other viral diseases: Secondary | ICD-10-CM | POA: Diagnosis not present

## 2020-09-09 DIAGNOSIS — E559 Vitamin D deficiency, unspecified: Secondary | ICD-10-CM | POA: Diagnosis not present

## 2020-09-09 DIAGNOSIS — E119 Type 2 diabetes mellitus without complications: Secondary | ICD-10-CM | POA: Diagnosis not present

## 2020-09-09 DIAGNOSIS — R5383 Other fatigue: Secondary | ICD-10-CM | POA: Diagnosis not present

## 2020-09-16 ENCOUNTER — Other Ambulatory Visit: Payer: Self-pay | Admitting: Adult Medicine

## 2020-09-19 ENCOUNTER — Other Ambulatory Visit: Payer: Self-pay | Admitting: Family Medicine

## 2020-09-19 DIAGNOSIS — Z780 Asymptomatic menopausal state: Secondary | ICD-10-CM

## 2020-09-26 DIAGNOSIS — G4733 Obstructive sleep apnea (adult) (pediatric): Secondary | ICD-10-CM | POA: Diagnosis not present

## 2020-10-07 DIAGNOSIS — R0602 Shortness of breath: Secondary | ICD-10-CM | POA: Diagnosis not present

## 2020-10-07 DIAGNOSIS — E119 Type 2 diabetes mellitus without complications: Secondary | ICD-10-CM | POA: Diagnosis not present

## 2020-10-25 DIAGNOSIS — E1122 Type 2 diabetes mellitus with diabetic chronic kidney disease: Secondary | ICD-10-CM | POA: Diagnosis not present

## 2020-10-25 DIAGNOSIS — E782 Mixed hyperlipidemia: Secondary | ICD-10-CM | POA: Diagnosis not present

## 2020-10-25 DIAGNOSIS — E1165 Type 2 diabetes mellitus with hyperglycemia: Secondary | ICD-10-CM | POA: Diagnosis not present

## 2020-10-25 DIAGNOSIS — I10 Essential (primary) hypertension: Secondary | ICD-10-CM | POA: Diagnosis not present

## 2020-10-25 DIAGNOSIS — N182 Chronic kidney disease, stage 2 (mild): Secondary | ICD-10-CM | POA: Diagnosis not present

## 2020-10-25 DIAGNOSIS — J45909 Unspecified asthma, uncomplicated: Secondary | ICD-10-CM | POA: Diagnosis not present

## 2020-10-25 DIAGNOSIS — E1169 Type 2 diabetes mellitus with other specified complication: Secondary | ICD-10-CM | POA: Diagnosis not present

## 2020-10-25 DIAGNOSIS — H35033 Hypertensive retinopathy, bilateral: Secondary | ICD-10-CM | POA: Diagnosis not present

## 2020-10-26 DIAGNOSIS — R310 Gross hematuria: Secondary | ICD-10-CM | POA: Diagnosis not present

## 2020-10-28 DIAGNOSIS — M25562 Pain in left knee: Secondary | ICD-10-CM | POA: Diagnosis not present

## 2020-10-31 DIAGNOSIS — E1169 Type 2 diabetes mellitus with other specified complication: Secondary | ICD-10-CM | POA: Diagnosis not present

## 2020-11-03 DIAGNOSIS — R310 Gross hematuria: Secondary | ICD-10-CM | POA: Diagnosis not present

## 2020-11-10 DIAGNOSIS — E1122 Type 2 diabetes mellitus with diabetic chronic kidney disease: Secondary | ICD-10-CM | POA: Diagnosis not present

## 2020-11-10 DIAGNOSIS — I10 Essential (primary) hypertension: Secondary | ICD-10-CM | POA: Diagnosis not present

## 2020-11-10 DIAGNOSIS — E1169 Type 2 diabetes mellitus with other specified complication: Secondary | ICD-10-CM | POA: Diagnosis not present

## 2020-11-10 DIAGNOSIS — E1165 Type 2 diabetes mellitus with hyperglycemia: Secondary | ICD-10-CM | POA: Diagnosis not present

## 2020-11-10 DIAGNOSIS — E782 Mixed hyperlipidemia: Secondary | ICD-10-CM | POA: Diagnosis not present

## 2020-11-10 DIAGNOSIS — H35033 Hypertensive retinopathy, bilateral: Secondary | ICD-10-CM | POA: Diagnosis not present

## 2020-11-10 DIAGNOSIS — J45909 Unspecified asthma, uncomplicated: Secondary | ICD-10-CM | POA: Diagnosis not present

## 2020-11-10 DIAGNOSIS — N182 Chronic kidney disease, stage 2 (mild): Secondary | ICD-10-CM | POA: Diagnosis not present

## 2020-11-11 DIAGNOSIS — E119 Type 2 diabetes mellitus without complications: Secondary | ICD-10-CM | POA: Diagnosis not present

## 2020-11-14 DIAGNOSIS — R1900 Intra-abdominal and pelvic swelling, mass and lump, unspecified site: Secondary | ICD-10-CM | POA: Diagnosis not present

## 2020-11-14 DIAGNOSIS — R319 Hematuria, unspecified: Secondary | ICD-10-CM | POA: Diagnosis not present

## 2020-11-16 ENCOUNTER — Encounter: Payer: Self-pay | Admitting: Podiatry

## 2020-11-16 ENCOUNTER — Other Ambulatory Visit: Payer: Self-pay

## 2020-11-16 ENCOUNTER — Ambulatory Visit: Payer: Medicare Other | Admitting: Podiatry

## 2020-11-16 DIAGNOSIS — M79676 Pain in unspecified toe(s): Secondary | ICD-10-CM

## 2020-11-16 DIAGNOSIS — B351 Tinea unguium: Secondary | ICD-10-CM | POA: Diagnosis not present

## 2020-11-16 DIAGNOSIS — E119 Type 2 diabetes mellitus without complications: Secondary | ICD-10-CM

## 2020-11-16 DIAGNOSIS — E1169 Type 2 diabetes mellitus with other specified complication: Secondary | ICD-10-CM | POA: Diagnosis not present

## 2020-11-16 DIAGNOSIS — Q828 Other specified congenital malformations of skin: Secondary | ICD-10-CM

## 2020-11-17 DIAGNOSIS — R310 Gross hematuria: Secondary | ICD-10-CM | POA: Diagnosis not present

## 2020-11-17 DIAGNOSIS — N201 Calculus of ureter: Secondary | ICD-10-CM | POA: Diagnosis not present

## 2020-11-20 NOTE — Progress Notes (Signed)
ANNUAL DIABETIC FOOT EXAM  Subjective: Tamara Stokes presents today for for annual diabetic foot examination and painful porokeratotic lesion(s) bilaterally and painful mycotic toenails that limit ambulation. Painful toenails interfere with ambulation. Aggravating factors include wearing enclosed shoe gear. Pain is relieved with periodic professional debridement. Painful porokeratotic lesions are aggravated when weightbearing with and without shoegear. Pain is relieved with periodic professional debridement..  Patient relates 6 year h/o diabetes.  Patient denies any h/o foot wounds.  Patient denies any numbness, tingling, burning, or pins/needle sensation in feet.  Patient's blood sugar was 83 mg/dl today.   Donald Prose, MD is patient's PCP. Last visit was 11/09/2020.  Past Medical History:  Diagnosis Date   Asthma    prn inhaler   Bilateral dry eyes    Bilateral hip pain    DDD (degenerative disc disease), lumbar    History of PID 2007   tubo-ovarian abscess   Hypertension    followed by pcp   Leg weakness, bilateral    due to OA bilateral hips   Mixed hyperlipidemia    OA (osteoarthritis)    knees   OA (osteoarthritis) of hip    RIGHT   OSA on CPAP    per study in epic 11/ 2005 very severe osa (per pt uses every night)   Osteopenia    Type 2 diabetes mellitus ([place])    followed by pcp   Wears glasses    Patient Active Problem List   Diagnosis Date Noted   Asthma 08/16/2020   Benign essential hypertension 08/16/2020   Chronic kidney disease, stage 2 (mild) 08/16/2020   Degeneration of lumbar intervertebral disc 08/16/2020   Diabetic renal disease ([place]) 08/16/2020   Drug-induced myopathy 08/16/2020   Hyperglycemia due to type 2 diabetes mellitus ([place]) 08/16/2020   Hypertensive retinopathy 08/16/2020   Lumbar radiculopathy 08/16/2020   Lumbosacral spondylosis without myelopathy 08/16/2020   Mixed hyperlipidemia 08/16/2020   Morbid obesity ([place])  08/16/2020   Other specified disorders of bone density and structure, other site 08/16/2020   Tobacco user 08/16/2020   Type 2 diabetes mellitus with other specified complication ([place]) 99/83/3825   Tenosynovitis of wrist 05/07/2019   OA (osteoarthritis) of hip 10/09/2017   Mass of neck 07/25/2017   Nasal vestibulitis 07/25/2017   Obstructive sleep apnea of adult 07/25/2017   Palatal papilloma 07/25/2017   Low back pain 07/18/2017   Spondylosis 07/18/2017   Past Surgical History:  Procedure Laterality Date   APPENDECTOMY  1983   CARPAL TUNNEL RELEASE Bilateral right 01/ 2002;  left 01-26-1999 dr Percell Miller   COLONOSCOPY  last one 2018   D & C HYSTEROSCOPY W/ RESECTION POLYPS  04-25-2009   dr stringer  @ [facility]   KNEE ARTHROSCOPY Right 06-13-2006  dr Percell Miller @[facility]    KNEE ARTHROSCOPY W/ LATERAL RELEASE Right    Left total hip arthroplasty     Dr. Wynelle Link 01-22-18    MULTIPLE TOOTH EXTRACTIONS     THUMB A-1 PULLEY RELEASE Left 08-14-2007   dr Percell Miller   TOTAL HIP ARTHROPLASTY Right 10/09/2017   Procedure: RIGHT TOTAL HIP ARTHROPLASTY ANTERIOR APPROACH;  Surgeon: Gaynelle Arabian, MD;  Location: [facility];  Service: Orthopedics;  Laterality: Right;   TOTAL HIP ARTHROPLASTY Left 01/22/2018   Procedure: TOTAL HIP ARTHROPLASTY ANTERIOR APPROACH;  Surgeon: Gaynelle Arabian, MD;  Location: [facility];  Service: Orthopedics;  Laterality: Left;  [id number]   UMBILICAL HERNIA REPAIR  1981   Current Outpatient Medications on File Prior to Visit  Medication  Sig Dispense Refill   albuterol (PROVENTIL HFA;VENTOLIN HFA) 108 (90 Base) MCG/ACT inhaler Inhale 1-2 puffs into the lungs every 6 (six) hours as needed for wheezing or shortness of breath.      Ascorbic Acid (VITAMIN C PO) Take 1 tablet by mouth daily.     Choline Fenofibrate 135 MG capsule Take 135 mg by mouth every morning.      Diethylpropion HCl CR 75 MG TB24 Take 1 tablet by mouth daily.     losartan-hydrochlorothiazide (HYZAAR) 100-25 MG tablet Take 1 tablet by  mouth every morning.      metFORMIN (GLUCOPHAGE) 500 MG tablet Take 1,000 mg by mouth 2 (two) times daily.     Multiple Vitamin (MULTIVITAMIN WITH MINERALS) TABS tablet Take 1 tablet by mouth daily.     OZEMPIC, 0.25 OR 0.5 MG/DOSE, 2 MG/1.5ML SOPN Inject 0.5 mg into the skin once a week.     RESTASIS 0.05 % ophthalmic emulsion Place 1 drop into both eyes 2 (two) times daily.      topiramate (TOPAMAX) 200 MG tablet Take 200 mg by mouth daily as needed.     vitamin B-12 (CYANOCOBALAMIN) 500 MCG tablet Take 500 mcg by mouth daily.     Vitamin D, Ergocalciferol, (DRISDOL) 1.25 MG (50000 UNIT) CAPS capsule Take 50,000 Units by mouth once a week.     WIXELA INHUB 100-50 MCG/ACT AEPB Inhale into the lungs.     No current facility-administered medications on file prior to visit.    Allergies  Allergen Reactions   Atorvastatin Other (See Comments)    myalgia Other reaction(s): myalgia   Ezetimibe Other (See Comments)    Leg pains Other reaction(s): leg pains   Lisinopril Other (See Comments)    Worsens asthma Other reaction(s): worsening asthma   Losartan Potassium-Hctz     Other reaction(s): HA, blurred vision Other reaction(s): HA, blurred vision   Rosuvastatin Other (See Comments)    myalgia Other reaction(s): myalgia   Social History   Occupational History   Not on file  Tobacco Use   Smoking status: Some Days    Packs/day: 0.20    Years: 30.00    Pack years: 6.00    Types: Cigarettes   Smokeless tobacco: Never   Tobacco comments:    "Smokes 1/2 cig every fews days or so"  Vaping Use   Vaping Use: Never used  Substance and Sexual Activity   Alcohol use: No   Drug use: Never   Sexual activity: Not Currently    Birth control/protection: Post-menopausal   Family History  Problem Relation Age of Onset   Diabetes Mother    Kidney disease Mother    Hypertension Mother    Diabetes Father    Hypertension Father    Immunization History  Administered Date(s) Administered    Hepatitis B, adult 11/27/2011, 01/11/2012, 06/10/2012   Influenza,inj,quad, With Preservative 11/16/2013   PFIZER(Purple Top)SARS-COV-2 Vaccination 10/12/2019, 11/06/2019, 06/12/2020   Pneumococcal Conjugate-13 07/28/2019   Pneumococcal Polysaccharide-23 11/19/2011, 05/21/2016   Pneumococcal-Unspecified 01/02/2016   Td 05/22/2017   Tdap 12/20/2006   Zoster, Live 07/28/2019, 01/19/2020     Review of Systems: Negative except as noted in the HPI.   Objective: There were no vitals filed for this visit.  Tamara Stokes is a pleasant 65 y.o. female in NAD. AAO X 3.  Vascular Examination: Capillary refill time to digits immediate b/l. Palpable DP pulse(s) b/l LE. Palpable PT pulse(s) b/l LE. Pedal hair sparse. No pain with calf compression  RLE. No edema noted b/l LE. No cyanosis or clubbing noted b/l LE.  Dermatological Examination: Pedal skin is warm and supple b/l LE. No open wounds b/l LE. No interdigital macerations noted b/l LE. Toenails 2-5 bilaterally and L hallux elongated, discolored, dystrophic, thickened, and crumbly with subungual debris and tenderness to dorsal palpation. Anonychia noted R hallux. Nailbed(s) epithelialized.  Porokeratotic lesion(s) submet head 3 right foot and 1st metatarsal head left foot. No erythema, no edema, no drainage, no fluctuance.  Musculoskeletal Examination: Muscle strength 5/5 to all lower extremity muscle groups bilaterally. Hammertoe deformity noted 2-5 b/l.  Footwear Assessment: Does the patient wear appropriate shoes? Yes. Does the patient need inserts/orthotics? Yes.  Neurological Examination: Protective sensation intact 5/5 intact bilaterally with 10g monofilament b/l. Vibratory sensation intact b/l.  Assessment: 1. Pain due to onychomycosis of toenail   2. Porokeratosis   3. Type 2 diabetes mellitus with other specified complication, without long-term current use of insulin ([place])   4. Encounter for diabetic foot exam ([place])     ADA Risk Categorization: Low Risk :  Patient has all of the following: Intact protective sensation No prior foot ulcer  No severe deformity Pedal pulses present  Plan: -Examined patient. -Diabetic foot examination performed today. -Continue foot and shoe inspections daily. Monitor blood glucose per PCP/Endocrinologist's recommendations. -Toenails 2-5 bilaterally and L hallux debrided in length and girth without iatrogenic bleeding with sterile nail nipper and dremel.  -Painful porokeratotic lesion(s) submet head 3 left foot and 1st metatarsal head left foot pared and enucleated with sterile scalpel blade without incident. Total number of lesions debrided=2. -Patient/POA to call should there be question/concern in the interim.  Return in about 3 months (around 02/16/2021).  Marzetta Board, DPM

## 2020-11-29 DIAGNOSIS — E1169 Type 2 diabetes mellitus with other specified complication: Secondary | ICD-10-CM | POA: Diagnosis not present

## 2020-11-30 DIAGNOSIS — E1169 Type 2 diabetes mellitus with other specified complication: Secondary | ICD-10-CM | POA: Diagnosis not present

## 2020-12-07 DIAGNOSIS — I10 Essential (primary) hypertension: Secondary | ICD-10-CM | POA: Diagnosis not present

## 2020-12-07 DIAGNOSIS — H35033 Hypertensive retinopathy, bilateral: Secondary | ICD-10-CM | POA: Diagnosis not present

## 2020-12-07 DIAGNOSIS — E782 Mixed hyperlipidemia: Secondary | ICD-10-CM | POA: Diagnosis not present

## 2020-12-07 DIAGNOSIS — E1122 Type 2 diabetes mellitus with diabetic chronic kidney disease: Secondary | ICD-10-CM | POA: Diagnosis not present

## 2020-12-07 DIAGNOSIS — N182 Chronic kidney disease, stage 2 (mild): Secondary | ICD-10-CM | POA: Diagnosis not present

## 2020-12-07 DIAGNOSIS — E1169 Type 2 diabetes mellitus with other specified complication: Secondary | ICD-10-CM | POA: Diagnosis not present

## 2020-12-07 DIAGNOSIS — J45909 Unspecified asthma, uncomplicated: Secondary | ICD-10-CM | POA: Diagnosis not present

## 2020-12-07 DIAGNOSIS — E1165 Type 2 diabetes mellitus with hyperglycemia: Secondary | ICD-10-CM | POA: Diagnosis not present

## 2020-12-10 DIAGNOSIS — E119 Type 2 diabetes mellitus without complications: Secondary | ICD-10-CM | POA: Diagnosis not present

## 2020-12-12 DIAGNOSIS — K3189 Other diseases of stomach and duodenum: Secondary | ICD-10-CM | POA: Diagnosis not present

## 2020-12-13 DIAGNOSIS — K3189 Other diseases of stomach and duodenum: Secondary | ICD-10-CM | POA: Insufficient documentation

## 2020-12-21 DIAGNOSIS — N201 Calculus of ureter: Secondary | ICD-10-CM | POA: Diagnosis not present

## 2020-12-27 DIAGNOSIS — G4733 Obstructive sleep apnea (adult) (pediatric): Secondary | ICD-10-CM | POA: Diagnosis not present

## 2020-12-30 DIAGNOSIS — N201 Calculus of ureter: Secondary | ICD-10-CM | POA: Diagnosis not present

## 2020-12-30 DIAGNOSIS — R310 Gross hematuria: Secondary | ICD-10-CM | POA: Diagnosis not present

## 2020-12-30 DIAGNOSIS — E1169 Type 2 diabetes mellitus with other specified complication: Secondary | ICD-10-CM | POA: Diagnosis not present

## 2021-01-03 DIAGNOSIS — R9389 Abnormal findings on diagnostic imaging of other specified body structures: Secondary | ICD-10-CM | POA: Diagnosis not present

## 2021-01-07 DIAGNOSIS — E559 Vitamin D deficiency, unspecified: Secondary | ICD-10-CM | POA: Diagnosis not present

## 2021-01-07 DIAGNOSIS — Z79899 Other long term (current) drug therapy: Secondary | ICD-10-CM | POA: Diagnosis not present

## 2021-01-07 DIAGNOSIS — E119 Type 2 diabetes mellitus without complications: Secondary | ICD-10-CM | POA: Diagnosis not present

## 2021-01-07 DIAGNOSIS — E7800 Pure hypercholesterolemia, unspecified: Secondary | ICD-10-CM | POA: Diagnosis not present

## 2021-01-11 DIAGNOSIS — N202 Calculus of kidney with calculus of ureter: Secondary | ICD-10-CM | POA: Diagnosis not present

## 2021-01-18 DIAGNOSIS — N201 Calculus of ureter: Secondary | ICD-10-CM | POA: Diagnosis not present

## 2021-01-20 DIAGNOSIS — Z96643 Presence of artificial hip joint, bilateral: Secondary | ICD-10-CM | POA: Diagnosis not present

## 2021-01-30 DIAGNOSIS — E1122 Type 2 diabetes mellitus with diabetic chronic kidney disease: Secondary | ICD-10-CM | POA: Diagnosis not present

## 2021-02-04 DIAGNOSIS — E119 Type 2 diabetes mellitus without complications: Secondary | ICD-10-CM | POA: Diagnosis not present

## 2021-02-07 DIAGNOSIS — R935 Abnormal findings on diagnostic imaging of other abdominal regions, including retroperitoneum: Secondary | ICD-10-CM | POA: Diagnosis not present

## 2021-02-07 DIAGNOSIS — K2930 Chronic superficial gastritis without bleeding: Secondary | ICD-10-CM | POA: Diagnosis not present

## 2021-02-07 DIAGNOSIS — D481 Neoplasm of uncertain behavior of connective and other soft tissue: Secondary | ICD-10-CM | POA: Diagnosis not present

## 2021-02-15 DIAGNOSIS — I10 Essential (primary) hypertension: Secondary | ICD-10-CM | POA: Diagnosis not present

## 2021-02-15 DIAGNOSIS — E1169 Type 2 diabetes mellitus with other specified complication: Secondary | ICD-10-CM | POA: Diagnosis not present

## 2021-02-15 DIAGNOSIS — E1165 Type 2 diabetes mellitus with hyperglycemia: Secondary | ICD-10-CM | POA: Diagnosis not present

## 2021-02-15 DIAGNOSIS — E782 Mixed hyperlipidemia: Secondary | ICD-10-CM | POA: Diagnosis not present

## 2021-02-16 DIAGNOSIS — K2930 Chronic superficial gastritis without bleeding: Secondary | ICD-10-CM | POA: Diagnosis not present

## 2021-02-24 ENCOUNTER — Encounter: Payer: Self-pay | Admitting: Podiatry

## 2021-02-24 ENCOUNTER — Other Ambulatory Visit: Payer: Self-pay

## 2021-02-24 ENCOUNTER — Ambulatory Visit: Payer: Medicare Other | Admitting: Podiatry

## 2021-02-24 DIAGNOSIS — M79676 Pain in unspecified toe(s): Secondary | ICD-10-CM | POA: Diagnosis not present

## 2021-02-24 DIAGNOSIS — K2930 Chronic superficial gastritis without bleeding: Secondary | ICD-10-CM | POA: Insufficient documentation

## 2021-02-24 DIAGNOSIS — E1169 Type 2 diabetes mellitus with other specified complication: Secondary | ICD-10-CM

## 2021-02-24 DIAGNOSIS — R9389 Abnormal findings on diagnostic imaging of other specified body structures: Secondary | ICD-10-CM | POA: Insufficient documentation

## 2021-02-24 DIAGNOSIS — I700 Atherosclerosis of aorta: Secondary | ICD-10-CM | POA: Insufficient documentation

## 2021-02-24 DIAGNOSIS — Q828 Other specified congenital malformations of skin: Secondary | ICD-10-CM

## 2021-02-24 DIAGNOSIS — B351 Tinea unguium: Secondary | ICD-10-CM | POA: Diagnosis not present

## 2021-03-01 DIAGNOSIS — E782 Mixed hyperlipidemia: Secondary | ICD-10-CM | POA: Diagnosis not present

## 2021-03-01 DIAGNOSIS — N182 Chronic kidney disease, stage 2 (mild): Secondary | ICD-10-CM | POA: Diagnosis not present

## 2021-03-01 DIAGNOSIS — E1122 Type 2 diabetes mellitus with diabetic chronic kidney disease: Secondary | ICD-10-CM | POA: Diagnosis not present

## 2021-03-01 DIAGNOSIS — G720 Drug-induced myopathy: Secondary | ICD-10-CM | POA: Diagnosis not present

## 2021-03-01 DIAGNOSIS — I10 Essential (primary) hypertension: Secondary | ICD-10-CM | POA: Diagnosis not present

## 2021-03-01 DIAGNOSIS — Z1389 Encounter for screening for other disorder: Secondary | ICD-10-CM | POA: Diagnosis not present

## 2021-03-01 DIAGNOSIS — I700 Atherosclerosis of aorta: Secondary | ICD-10-CM | POA: Diagnosis not present

## 2021-03-02 DIAGNOSIS — M25561 Pain in right knee: Secondary | ICD-10-CM | POA: Insufficient documentation

## 2021-03-02 DIAGNOSIS — N201 Calculus of ureter: Secondary | ICD-10-CM | POA: Diagnosis not present

## 2021-03-03 ENCOUNTER — Ambulatory Visit
Admission: RE | Admit: 2021-03-03 | Discharge: 2021-03-03 | Disposition: A | Discharge status: Home / Self Care | Payer: Medicare Other | Source: Ambulatory Visit | Attending: Family Medicine | Admitting: Family Medicine

## 2021-03-03 DIAGNOSIS — M8588 Other specified disorders of bone density and structure, other site: Secondary | ICD-10-CM | POA: Diagnosis not present

## 2021-03-03 DIAGNOSIS — M25561 Pain in right knee: Secondary | ICD-10-CM | POA: Diagnosis not present

## 2021-03-03 DIAGNOSIS — M25562 Pain in left knee: Secondary | ICD-10-CM | POA: Diagnosis not present

## 2021-03-03 DIAGNOSIS — Z780 Asymptomatic menopausal state: Secondary | ICD-10-CM | POA: Diagnosis not present

## 2021-03-03 DIAGNOSIS — M1711 Unilateral primary osteoarthritis, right knee: Secondary | ICD-10-CM | POA: Diagnosis not present

## 2021-03-03 DIAGNOSIS — M1712 Unilateral primary osteoarthritis, left knee: Secondary | ICD-10-CM | POA: Diagnosis not present

## 2021-03-03 NOTE — Progress Notes (Signed)
°  Subjective:  Patient ID: Tamara Stokes, female    DOB: Mar 12, 1955,  MRN: [id number]  Tamara Stokes presents to clinic today for preventative diabetic foot care and painful porokeratotic lesion(s) left foot and painful mycotic toenails that limit ambulation. Painful toenails interfere with ambulation. Aggravating factors include wearing enclosed shoe gear. Pain is relieved with periodic professional debridement. Painful porokeratotic lesions are aggravated when weightbearing with and without shoegear. Pain is relieved with periodic professional debridement.  Patient states blood glucose was 134 mg/dl today.    New problem(s): None.   PCP is Donald Prose, MD , and last visit was January 30, 2021.  Allergies  Allergen Reactions   Atorvastatin Other (See Comments)    myalgia Other reaction(s): myalgia   Ezetimibe Other (See Comments)    Leg pains Other reaction(s): leg pains   Lisinopril Other (See Comments)    Worsens asthma Other reaction(s): worsening asthma   Losartan Potassium-Hctz     Other reaction(s): HA, blurred vision Other reaction(s): HA, blurred vision   Rosuvastatin Other (See Comments)    myalgia Other reaction(s): myalgia Other reaction(s): myalgia   Statins Swelling    Review of Systems: Negative except as noted in the HPI. Objective:   Constitutional Tamara Stokes is a pleasant 66 y.o. African American female, WD, WN in NAD. AAO x 3.   Vascular CFT immediate b/l LE. Palpable DP/PT pulses b/l LE. Digital hair sparse b/l. Skin temperature gradient WNL b/l. No pain with calf compression b/l. No edema noted b/l. No cyanosis or clubbing noted b/l LE.  Neurologic Normal speech. Oriented to person, place, and time. Protective sensation intact 5/5 intact bilaterally with 10g monofilament b/l. Vibratory sensation intact b/l.  Dermatologic Pedal integument with normal turgor, texture and tone BLE. No open wounds b/l LE. No interdigital  macerations noted b/l LE. Toenails 2-5 bilaterally and L hallux elongated, discolored, dystrophic, thickened, and crumbly with subungual debris and tenderness to dorsal palpation. Anonychia noted R hallux. Nailbed(s) epithelialized.  Porokeratotic lesion(s) submet head 3 right foot and 1st metatarsal head left foot. No erythema, no edema, no drainage, no fluctuance.  Orthopedic: Muscle strength 5/5 to all lower extremity muscle groups bilaterally. Hammertoe deformity noted 2-5 b/l.   Radiographs: None  Last A1c: No flowsheet data found.   Assessment:   1. Pain due to onychomycosis of toenail   2. Porokeratosis   3. Type 2 diabetes mellitus with other specified complication, without long-term current use of insulin ([place])    Plan:  Patient was evaluated and treated and all questions answered. Consent given for treatment as described below: -Mycotic toenails 2-5 bilaterally and L hallux were debrided in length and girth with sterile nail nippers and dremel without iatrogenic bleeding. -Painful porokeratotic lesion(s) submet head 3 left foot and 1st metatarsal head left foot pared and enucleated with sterile scalpel blade without incident. Total number of lesions debrided=.2. -Patient/POA to call should there be question/concern in the interim.  Return in about 3 months (around 05/24/2021).  Marzetta Board, DPM

## 2021-03-06 DIAGNOSIS — E782 Mixed hyperlipidemia: Secondary | ICD-10-CM | POA: Diagnosis not present

## 2021-03-06 DIAGNOSIS — I10 Essential (primary) hypertension: Secondary | ICD-10-CM | POA: Diagnosis not present

## 2021-03-06 DIAGNOSIS — E1122 Type 2 diabetes mellitus with diabetic chronic kidney disease: Secondary | ICD-10-CM | POA: Diagnosis not present

## 2021-03-09 DIAGNOSIS — E119 Type 2 diabetes mellitus without complications: Secondary | ICD-10-CM | POA: Diagnosis not present

## 2021-03-22 DIAGNOSIS — H2513 Age-related nuclear cataract, bilateral: Secondary | ICD-10-CM | POA: Diagnosis not present

## 2021-03-22 DIAGNOSIS — E119 Type 2 diabetes mellitus without complications: Secondary | ICD-10-CM | POA: Diagnosis not present

## 2021-03-22 DIAGNOSIS — H5201 Hypermetropia, right eye: Secondary | ICD-10-CM | POA: Diagnosis not present

## 2021-03-30 DIAGNOSIS — E1169 Type 2 diabetes mellitus with other specified complication: Secondary | ICD-10-CM | POA: Diagnosis not present

## 2021-04-14 DIAGNOSIS — M1711 Unilateral primary osteoarthritis, right knee: Secondary | ICD-10-CM | POA: Diagnosis not present

## 2021-04-19 DIAGNOSIS — E1122 Type 2 diabetes mellitus with diabetic chronic kidney disease: Secondary | ICD-10-CM | POA: Diagnosis not present

## 2021-04-19 DIAGNOSIS — E782 Mixed hyperlipidemia: Secondary | ICD-10-CM | POA: Diagnosis not present

## 2021-04-19 DIAGNOSIS — N182 Chronic kidney disease, stage 2 (mild): Secondary | ICD-10-CM | POA: Diagnosis not present

## 2021-04-19 DIAGNOSIS — I10 Essential (primary) hypertension: Secondary | ICD-10-CM | POA: Diagnosis not present

## 2021-04-27 DIAGNOSIS — E1169 Type 2 diabetes mellitus with other specified complication: Secondary | ICD-10-CM | POA: Diagnosis not present

## 2021-04-30 DIAGNOSIS — E1169 Type 2 diabetes mellitus with other specified complication: Secondary | ICD-10-CM | POA: Diagnosis not present

## 2021-05-26 ENCOUNTER — Other Ambulatory Visit: Payer: Self-pay | Admitting: Family Medicine

## 2021-05-26 DIAGNOSIS — Z1231 Encounter for screening mammogram for malignant neoplasm of breast: Secondary | ICD-10-CM

## 2021-05-30 DIAGNOSIS — N182 Chronic kidney disease, stage 2 (mild): Secondary | ICD-10-CM | POA: Diagnosis not present

## 2021-05-30 DIAGNOSIS — I10 Essential (primary) hypertension: Secondary | ICD-10-CM | POA: Diagnosis not present

## 2021-05-30 DIAGNOSIS — E782 Mixed hyperlipidemia: Secondary | ICD-10-CM | POA: Diagnosis not present

## 2021-05-30 DIAGNOSIS — E1122 Type 2 diabetes mellitus with diabetic chronic kidney disease: Secondary | ICD-10-CM | POA: Diagnosis not present

## 2021-05-31 DIAGNOSIS — E1122 Type 2 diabetes mellitus with diabetic chronic kidney disease: Secondary | ICD-10-CM | POA: Diagnosis not present

## 2021-06-02 ENCOUNTER — Ambulatory Visit: Payer: Medicare Other | Admitting: Podiatry

## 2021-06-05 DIAGNOSIS — E782 Mixed hyperlipidemia: Secondary | ICD-10-CM | POA: Diagnosis not present

## 2021-06-05 DIAGNOSIS — N182 Chronic kidney disease, stage 2 (mild): Secondary | ICD-10-CM | POA: Diagnosis not present

## 2021-06-05 DIAGNOSIS — I10 Essential (primary) hypertension: Secondary | ICD-10-CM | POA: Diagnosis not present

## 2021-06-05 DIAGNOSIS — E1122 Type 2 diabetes mellitus with diabetic chronic kidney disease: Secondary | ICD-10-CM | POA: Diagnosis not present

## 2021-06-12 ENCOUNTER — Ambulatory Visit: Payer: Medicare Other | Admitting: Podiatry

## 2021-06-12 ENCOUNTER — Encounter: Payer: Self-pay | Admitting: Podiatry

## 2021-06-12 DIAGNOSIS — M79674 Pain in right toe(s): Secondary | ICD-10-CM | POA: Diagnosis not present

## 2021-06-12 DIAGNOSIS — M79675 Pain in left toe(s): Secondary | ICD-10-CM | POA: Diagnosis not present

## 2021-06-12 DIAGNOSIS — B351 Tinea unguium: Secondary | ICD-10-CM | POA: Diagnosis not present

## 2021-06-12 DIAGNOSIS — E1169 Type 2 diabetes mellitus with other specified complication: Secondary | ICD-10-CM | POA: Diagnosis not present

## 2021-06-12 DIAGNOSIS — Q828 Other specified congenital malformations of skin: Secondary | ICD-10-CM | POA: Diagnosis not present

## 2021-06-18 NOTE — Progress Notes (Signed)
  Subjective:  Patient ID: Tamara Stokes, female    DOB: 1955-07-14,  MRN: [id number]  Christen Bame Platts presents to clinic today for preventative diabetic foot care and painful porokeratotic lesion(s) left lower extremity and painful mycotic toenails that limit ambulation. Painful toenails interfere with ambulation. Aggravating factors include wearing enclosed shoe gear. Pain is relieved with periodic professional debridement. Painful porokeratotic lesions are aggravated when weightbearing with and without shoegear. Pain is relieved with periodic professional debridement.  Patient states blood glucose was 130 mg/dl today.  Last known HgA1c was around 7%.  New problem(s): None.   PCP is Donald Prose, MD , and last visit was March 01, 2021.  Allergies  Allergen Reactions   Atorvastatin Other (See Comments)    myalgia Other reaction(s): myalgia   Ezetimibe Other (See Comments)    Leg pains Other reaction(s): leg pains   Lisinopril Other (See Comments)    Worsens asthma Other reaction(s): worsening asthma   Losartan Potassium-Hctz     Other reaction(s): HA, blurred vision Other reaction(s): HA, blurred vision   Rosuvastatin Other (See Comments)    myalgia Other reaction(s): myalgia Other reaction(s): myalgia   Statins Swelling    Review of Systems: Negative except as noted in the HPI.  Objective: No changes noted in today's physical examination.  Constitutional Tamara Stokes is a pleasant 66 y.o. African American female, WD, WN in NAD. AAO x 3.   Vascular CFT immediate b/l LE. Palpable DP/PT pulses b/l LE. Digital hair sparse b/l. Skin temperature gradient WNL b/l. No pain with calf compression b/l. No edema noted b/l. No cyanosis or clubbing noted b/l LE.  Neurologic Normal speech. Oriented to person, place, and time. Protective sensation intact 5/5 intact bilaterally with 10g monofilament b/l. Vibratory sensation intact b/l.  Dermatologic Pedal  integument with normal turgor, texture and tone BLE. No open wounds b/l LE. No interdigital macerations noted b/l LE. Toenails 2-5 bilaterally and L hallux elongated, discolored, dystrophic, thickened, and crumbly with subungual debris and tenderness to dorsal palpation. Anonychia noted R hallux. Nailbed(s) epithelialized.  Porokeratotic lesion(s) submet head 3 right foot and 1st metatarsal head left foot. No erythema, no edema, no drainage, no fluctuance.  Orthopedic: Muscle strength 5/5 to all lower extremity muscle groups bilaterally. Hammertoe deformity noted 2-5 b/l. Wearing Skechers on today's visit.   Radiographs: None  Assessment/Plan: 1. Pain due to onychomycosis of toenail   2. Porokeratosis   3. Type 2 diabetes mellitus with other specified complication, without long-term current use of insulin ([place])      -Examined patient. -Continue foot and shoe inspections daily. Monitor blood glucose per PCP/Endocrinologist's recommendations. -Patient to continue soft, supportive shoe gear daily. -Toenails 2-5 bilaterally and left great toe debrided in length and girth without iatrogenic bleeding with sterile nail nipper and dremel.  -Porokeratotic lesion(s) submet head 3 right foot and 1st metatarsal head left foot pared and enucleated with sterile scalpel blade without incident. Total number of lesions debrided=2. -Patient/POA to call should there be question/concern in the interim.   Return in about 3 months (around 09/12/2021).  Marzetta Board, DPM

## 2021-06-22 DIAGNOSIS — G4733 Obstructive sleep apnea (adult) (pediatric): Secondary | ICD-10-CM | POA: Diagnosis not present

## 2021-06-29 ENCOUNTER — Ambulatory Visit: Payer: Medicare Other

## 2021-06-29 ENCOUNTER — Ambulatory Visit
Admission: RE | Admit: 2021-06-29 | Discharge: 2021-06-29 | Disposition: A | Discharge status: Home / Self Care | Payer: Medicare Other | Source: Ambulatory Visit | Attending: Family Medicine | Admitting: Family Medicine

## 2021-06-29 DIAGNOSIS — Z1231 Encounter for screening mammogram for malignant neoplasm of breast: Secondary | ICD-10-CM

## 2021-06-30 DIAGNOSIS — E1169 Type 2 diabetes mellitus with other specified complication: Secondary | ICD-10-CM | POA: Diagnosis not present

## 2021-07-31 DIAGNOSIS — E1122 Type 2 diabetes mellitus with diabetic chronic kidney disease: Secondary | ICD-10-CM | POA: Diagnosis not present

## 2021-08-10 DIAGNOSIS — J069 Acute upper respiratory infection, unspecified: Secondary | ICD-10-CM | POA: Diagnosis not present

## 2021-08-13 DIAGNOSIS — J40 Bronchitis, not specified as acute or chronic: Secondary | ICD-10-CM | POA: Diagnosis not present

## 2021-08-29 DIAGNOSIS — I10 Essential (primary) hypertension: Secondary | ICD-10-CM | POA: Diagnosis not present

## 2021-08-29 DIAGNOSIS — E782 Mixed hyperlipidemia: Secondary | ICD-10-CM | POA: Diagnosis not present

## 2021-08-29 DIAGNOSIS — N182 Chronic kidney disease, stage 2 (mild): Secondary | ICD-10-CM | POA: Diagnosis not present

## 2021-08-29 DIAGNOSIS — E1122 Type 2 diabetes mellitus with diabetic chronic kidney disease: Secondary | ICD-10-CM | POA: Diagnosis not present

## 2021-09-06 DIAGNOSIS — G4733 Obstructive sleep apnea (adult) (pediatric): Secondary | ICD-10-CM | POA: Diagnosis not present

## 2021-09-06 DIAGNOSIS — E1122 Type 2 diabetes mellitus with diabetic chronic kidney disease: Secondary | ICD-10-CM | POA: Diagnosis not present

## 2021-09-06 DIAGNOSIS — N182 Chronic kidney disease, stage 2 (mild): Secondary | ICD-10-CM | POA: Diagnosis not present

## 2021-09-06 DIAGNOSIS — G720 Drug-induced myopathy: Secondary | ICD-10-CM | POA: Diagnosis not present

## 2021-09-06 DIAGNOSIS — I700 Atherosclerosis of aorta: Secondary | ICD-10-CM | POA: Diagnosis not present

## 2021-09-06 DIAGNOSIS — I10 Essential (primary) hypertension: Secondary | ICD-10-CM | POA: Diagnosis not present

## 2021-09-06 DIAGNOSIS — E782 Mixed hyperlipidemia: Secondary | ICD-10-CM | POA: Diagnosis not present

## 2021-09-06 DIAGNOSIS — Z0000 Encounter for general adult medical examination without abnormal findings: Secondary | ICD-10-CM | POA: Diagnosis not present

## 2021-09-13 DIAGNOSIS — K3189 Other diseases of stomach and duodenum: Secondary | ICD-10-CM | POA: Diagnosis not present

## 2021-09-25 ENCOUNTER — Ambulatory Visit: Payer: Medicare Other | Admitting: Podiatry

## 2021-09-25 ENCOUNTER — Encounter: Payer: Self-pay | Admitting: Podiatry

## 2021-09-25 DIAGNOSIS — Q828 Other specified congenital malformations of skin: Secondary | ICD-10-CM

## 2021-09-25 DIAGNOSIS — M79676 Pain in unspecified toe(s): Secondary | ICD-10-CM

## 2021-09-25 DIAGNOSIS — B351 Tinea unguium: Secondary | ICD-10-CM

## 2021-09-25 DIAGNOSIS — E1169 Type 2 diabetes mellitus with other specified complication: Secondary | ICD-10-CM | POA: Diagnosis not present

## 2021-09-30 NOTE — Progress Notes (Signed)
  Subjective:  Patient ID: Tamara Stokes, female    DOB: [birth year],  MRN: [id number]  Chief Complaint  Patient presents with   Nail Problem    Diabetic foot care BS-126 A1C-8.1 PCP-Sun PCP VST-09/06/2021    Tamara Stokes presents to clinic today for painful porokeratotic lesion(s) b/l lower extremities and painful mycotic toenails that limit ambulation. Painful toenails interfere with ambulation. Aggravating factors include wearing enclosed shoe gear. Pain is relieved with periodic professional debridement. Painful porokeratotic lesions are aggravated when weightbearing with and without shoegear. Pain is relieved with periodic professional debridement.  New problem(s): None.   PCP is Donald Prose, MD , and last visit was  May 31, 2021.  Allergies  Allergen Reactions   Atorvastatin Other (See Comments)    myalgia Other reaction(s): myalgia   Ezetimibe Other (See Comments)    Leg pains Other reaction(s): leg pains   Lisinopril Other (See Comments)    Worsens asthma Other reaction(s): worsening asthma   Losartan Potassium-Hctz     Other reaction(s): HA, blurred vision Other reaction(s): HA, blurred vision   Rosuvastatin Other (See Comments)    myalgia Other reaction(s): myalgia Other reaction(s): myalgia   Statins Swelling    Review of Systems: Negative except as noted in the HPI.  Objective: No changes noted in today's physical examination.  Tamara Stokes is a pleasant 66 y.o. female in NAD. AAO x 3. Vascular CFT immediate b/l LE. Palpable DP/PT pulses b/l LE. Digital hair sparse b/l. Skin temperature gradient WNL b/l. No pain with calf compression b/l. No edema noted b/l. No cyanosis or clubbing noted b/l LE.  Neurologic Normal speech. Oriented to person, place, and time. Protective sensation intact 5/5 intact bilaterally with 10g monofilament b/l. Vibratory sensation intact b/l.  Dermatologic Pedal integument with normal turgor, texture  and tone BLE. No open wounds b/l LE. No interdigital macerations noted b/l LE. Toenails 2-5 bilaterally and L hallux elongated, discolored, dystrophic, thickened, and crumbly with subungual debris and tenderness to dorsal palpation. Anonychia noted R hallux. Nailbed(s) epithelialized.    Porokeratotic lesion(s) submet head 3 right foot and 1st metatarsal head bilaterally. No erythema, no edema, no drainage, no fluctuance.  Orthopedic: Muscle strength 5/5 to all lower extremity muscle groups bilaterally. Hammertoe deformity noted 2-5 b/l. Wearing Skechers on today's visit.   Radiographs: None Assessment/Plan: 1. Pain due to onychomycosis of toenail   2. Porokeratosis   3. Type 2 diabetes mellitus with other specified complication, without long-term current use of insulin ([facility])     No orders of the defined types were placed in this encounter.  -Consent given for treatment as described below: -Examined patient. -Continue foot and shoe inspections daily. Monitor blood glucose per PCP/Endocrinologist's recommendations. -Mycotic toenails 1-5 bilaterally were debrided in length and girth with sterile nail nippers and dremel without incident. -Porokeratotic lesion(s) submet head 3 right foot and 1st metatarsal head b/l lower extremities pared and enucleated with sterile currette without incident. Total number of lesions debrided=3. -Patient to apply Neosporin to submet head 3 lesion once daily for one week. -Patient/POA to call should there be question/concern in the interim.   Return in about 3 months (around 12/25/2021).  Marzetta Board, DPM

## 2021-11-25 DIAGNOSIS — R040 Epistaxis: Secondary | ICD-10-CM | POA: Diagnosis not present

## 2021-11-28 DIAGNOSIS — N182 Chronic kidney disease, stage 2 (mild): Secondary | ICD-10-CM | POA: Diagnosis not present

## 2021-11-28 DIAGNOSIS — E782 Mixed hyperlipidemia: Secondary | ICD-10-CM | POA: Diagnosis not present

## 2021-11-28 DIAGNOSIS — I10 Essential (primary) hypertension: Secondary | ICD-10-CM | POA: Diagnosis not present

## 2021-11-28 DIAGNOSIS — E1122 Type 2 diabetes mellitus with diabetic chronic kidney disease: Secondary | ICD-10-CM | POA: Diagnosis not present

## 2021-12-08 DIAGNOSIS — E1122 Type 2 diabetes mellitus with diabetic chronic kidney disease: Secondary | ICD-10-CM | POA: Diagnosis not present

## 2021-12-12 DIAGNOSIS — N182 Chronic kidney disease, stage 2 (mild): Secondary | ICD-10-CM | POA: Diagnosis not present

## 2021-12-12 DIAGNOSIS — R040 Epistaxis: Secondary | ICD-10-CM | POA: Diagnosis not present

## 2021-12-12 DIAGNOSIS — E1122 Type 2 diabetes mellitus with diabetic chronic kidney disease: Secondary | ICD-10-CM | POA: Diagnosis not present

## 2021-12-12 DIAGNOSIS — E559 Vitamin D deficiency, unspecified: Secondary | ICD-10-CM | POA: Diagnosis not present

## 2022-01-09 ENCOUNTER — Ambulatory Visit: Payer: Medicare Other | Admitting: Podiatry

## 2022-02-12 IMAGING — MG MM DIGITAL SCREENING BILAT W/ TOMO AND CAD
6 of 10 series · 6 of 30 positions shown · non-contrast
Comparison: Previous exam(s).

CLINICAL DATA: Screening.

EXAM:
DIGITAL SCREENING BILATERAL MAMMOGRAM WITH TOMOSYNTHESIS AND CAD
TECHNIQUE: Bilateral screening digital craniocaudal and mediolateral oblique
mammograms were obtained. Bilateral screening digital breast
tomosynthesis was performed. The images were evaluated with
computer-aided detection.

[R CC synth-2D]
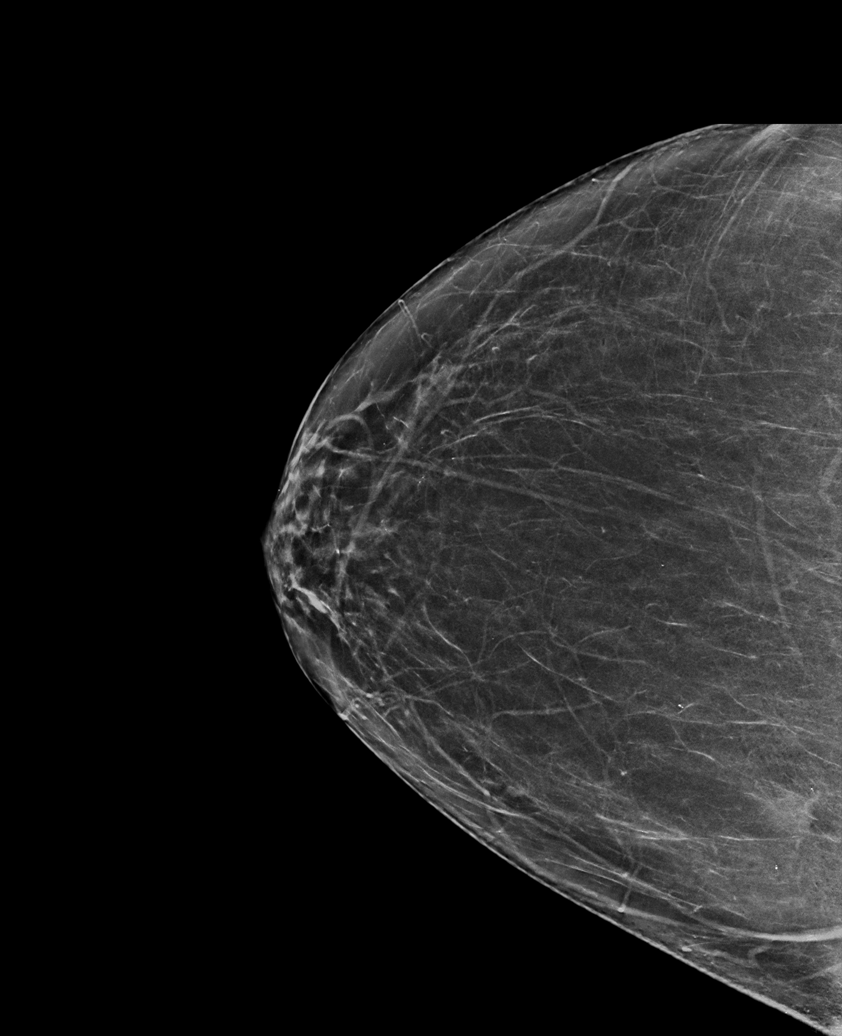

[L CC synth-2D]
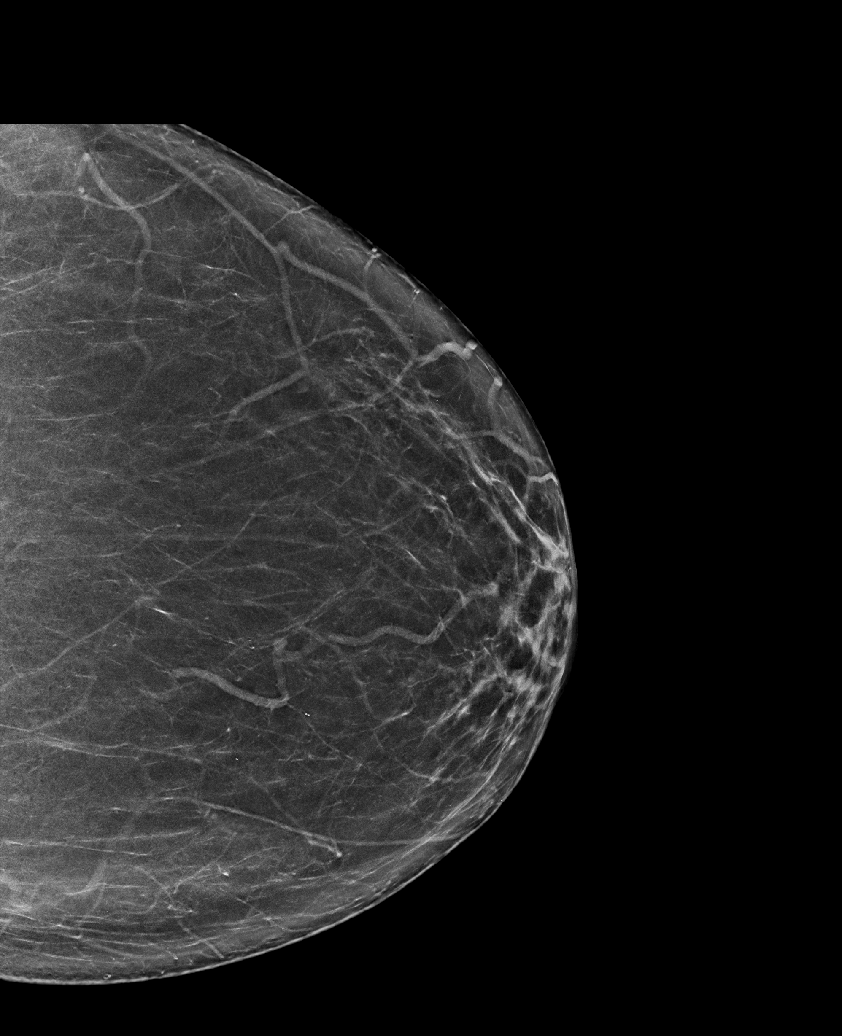

[L MLO synth-2D]
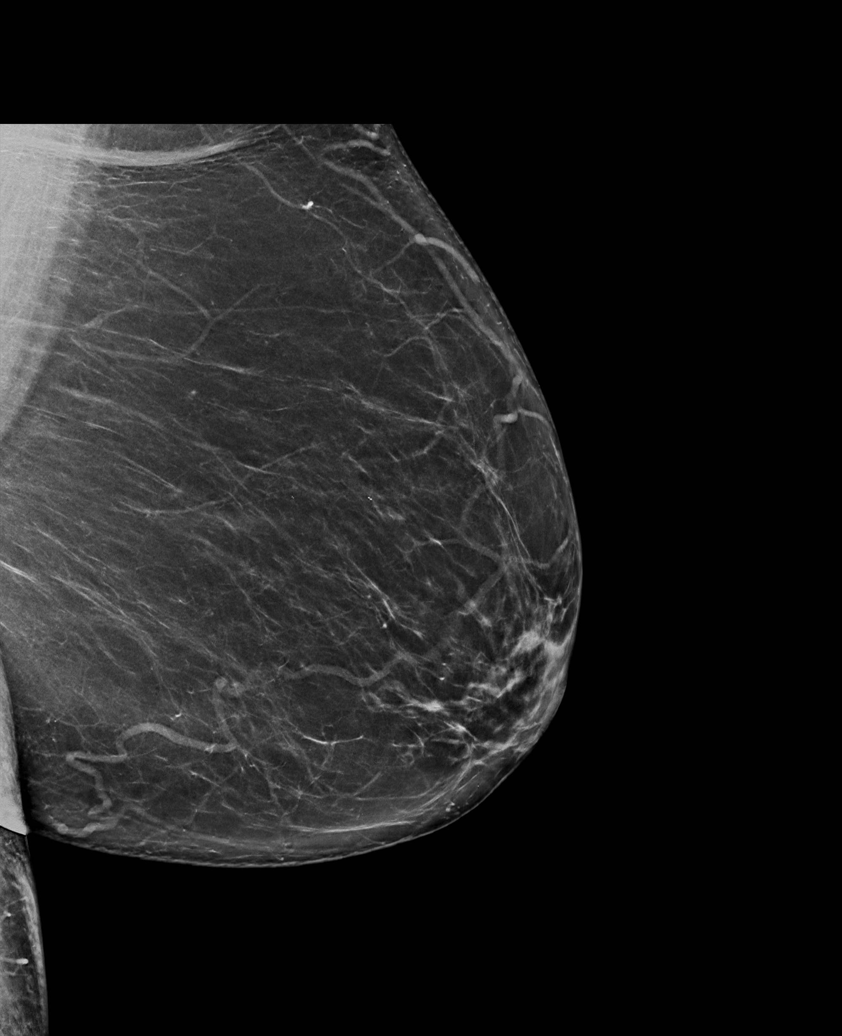

[R MLO synth-2D (1 of 2)]
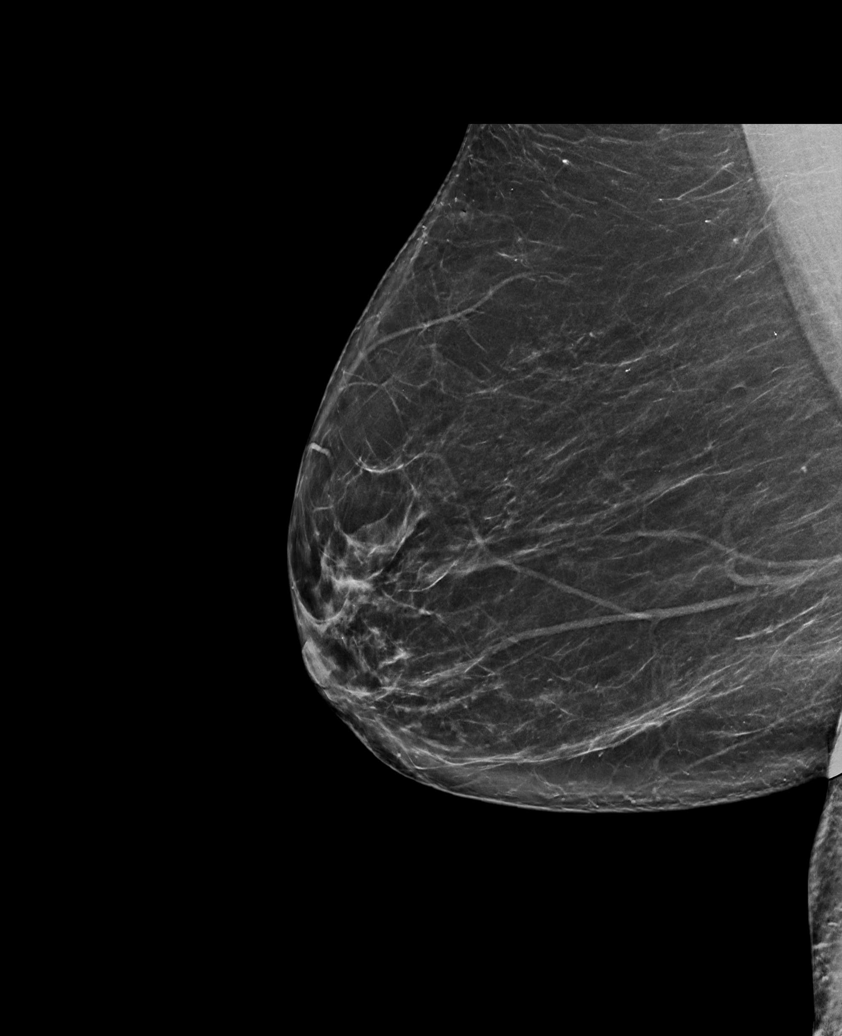

[R MLO synth-2D (2 of 2)]
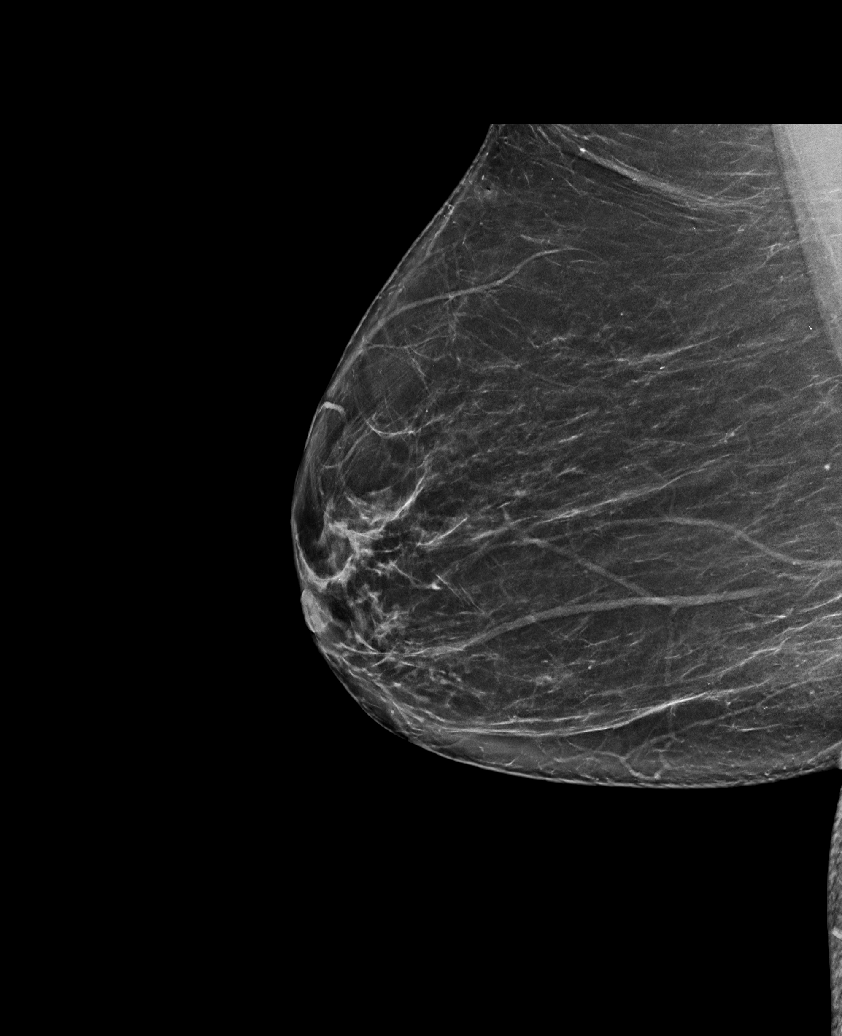

[R MLO tomo · tomo slice 38/75.0]
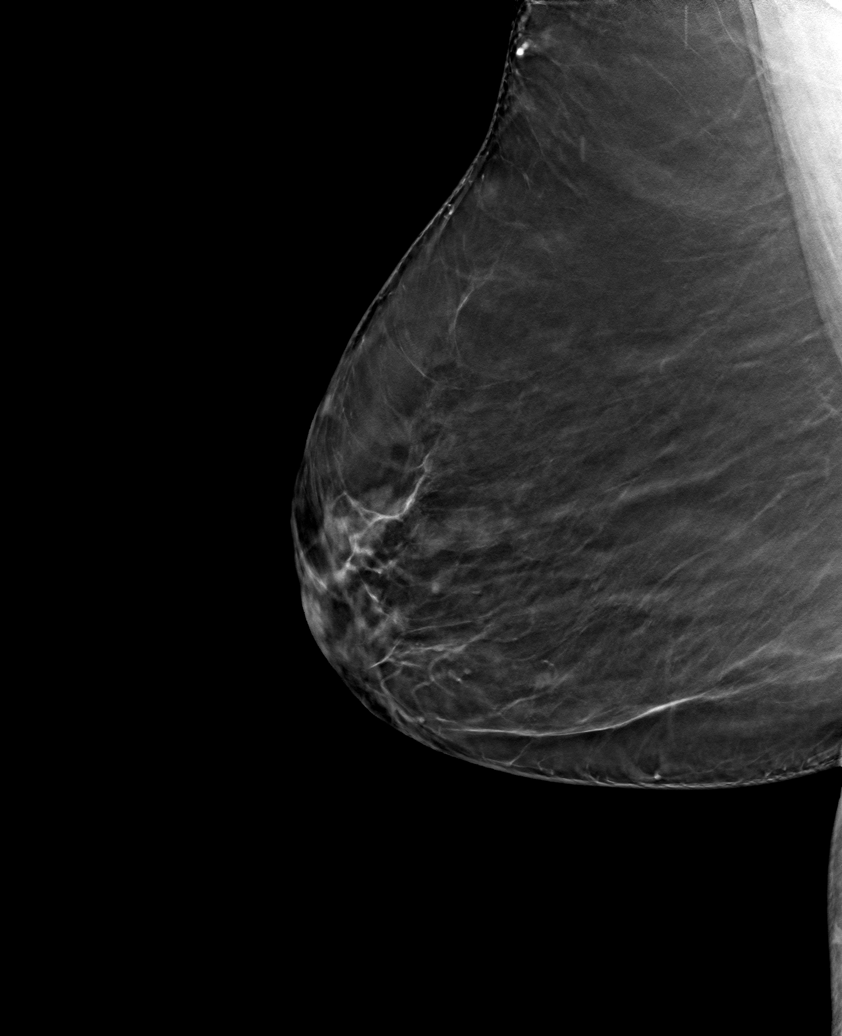

[6 of 30 positions shown; findings below may reference images not displayed]

ACR Breast Density Category b: There are scattered areas of
fibroglandular density.
FINDINGS: There are no findings suspicious for malignancy.
IMPRESSION: No mammographic evidence of malignancy. A result letter of this
screening mammogram will be mailed directly to the patient.

RECOMMENDATION:
Screening mammogram in one year. (Code:51-O-LD2)

BI-RADS CATEGORY  1: Negative.

## 2022-02-20 DIAGNOSIS — J019 Acute sinusitis, unspecified: Secondary | ICD-10-CM | POA: Diagnosis not present

## 2022-02-20 DIAGNOSIS — I10 Essential (primary) hypertension: Secondary | ICD-10-CM | POA: Diagnosis not present

## 2022-02-20 DIAGNOSIS — R519 Headache, unspecified: Secondary | ICD-10-CM | POA: Diagnosis not present

## 2022-02-20 DIAGNOSIS — N182 Chronic kidney disease, stage 2 (mild): Secondary | ICD-10-CM | POA: Diagnosis not present

## 2022-02-20 DIAGNOSIS — E1122 Type 2 diabetes mellitus with diabetic chronic kidney disease: Secondary | ICD-10-CM | POA: Diagnosis not present

## 2022-03-09 DIAGNOSIS — E782 Mixed hyperlipidemia: Secondary | ICD-10-CM | POA: Diagnosis not present

## 2022-03-09 DIAGNOSIS — E1122 Type 2 diabetes mellitus with diabetic chronic kidney disease: Secondary | ICD-10-CM | POA: Diagnosis not present

## 2022-03-09 DIAGNOSIS — I700 Atherosclerosis of aorta: Secondary | ICD-10-CM | POA: Diagnosis not present

## 2022-03-09 DIAGNOSIS — N181 Chronic kidney disease, stage 1: Secondary | ICD-10-CM | POA: Diagnosis not present

## 2022-03-09 DIAGNOSIS — G720 Drug-induced myopathy: Secondary | ICD-10-CM | POA: Diagnosis not present

## 2022-03-09 DIAGNOSIS — I10 Essential (primary) hypertension: Secondary | ICD-10-CM | POA: Diagnosis not present

## 2022-03-13 DIAGNOSIS — M25511 Pain in right shoulder: Secondary | ICD-10-CM | POA: Diagnosis not present

## 2022-03-13 DIAGNOSIS — M79601 Pain in right arm: Secondary | ICD-10-CM | POA: Diagnosis not present

## 2022-03-18 ENCOUNTER — Ambulatory Visit
Admission: EM | Admit: 2022-03-18 | Discharge: 2022-03-18 | Disposition: A | Discharge status: Home / Self Care | Payer: Medicare Other | Attending: Internal Medicine | Admitting: Internal Medicine

## 2022-03-18 DIAGNOSIS — S83401A Sprain of unspecified collateral ligament of right knee, initial encounter: Secondary | ICD-10-CM

## 2022-03-18 DIAGNOSIS — S43401A Unspecified sprain of right shoulder joint, initial encounter: Secondary | ICD-10-CM | POA: Diagnosis not present

## 2022-03-18 NOTE — ED Triage Notes (Signed)
Pt presents with right shoulder pain, right upper arm pain, right wrist pain, and right knee pain after a fall X 2 days ago at work.

## 2022-03-18 NOTE — ED Provider Notes (Signed)
[name]    CSN: [id number] Arrival date & time: 03/18/22  1310      History   Chief Complaint Chief Complaint  Patient presents with   Fall    HPI Tamara Stokes is a 67 y.o. female comes to the urgent care with right shoulder, right elbow and right wrist pain as well as right knee pain.  Patient's symptoms started 2 days ago after she lost her balance and fell down a flight of stairs.  This event happened at work.  She describes the pain as sharp, throbbing and aggravated by movement.  It is relieved by taking meloxicam and icing the areas involved.  Patient had right knee swelling after the fall.  This has improved with icing of the right knee.  Right upper arm had some bruising and swelling.  This has resolved with icing.  Patient is able to bear weight on the right leg with some discomfort.  No numbness or tingling in the extremities.   HPI  Past Medical History:  Diagnosis Date   Asthma    prn inhaler   Bilateral dry eyes    Bilateral hip pain    DDD (degenerative disc disease), lumbar    History of PID 2007   tubo-ovarian abscess   Hypertension    followed by pcp   Leg weakness, bilateral    due to OA bilateral hips   Mixed hyperlipidemia    OA (osteoarthritis)    knees   OA (osteoarthritis) of hip    RIGHT   OSA on CPAP    per study in epic 11/ 2005 very severe osa (per pt uses every night)   Osteopenia    Type 2 diabetes mellitus ([place])    followed by pcp   Wears glasses     Patient Active Problem List   Diagnosis Date Noted   Abnormal findings on diagnostic imaging of other specified body structures 02/24/2021   Chronic superficial gastritis without bleeding 02/24/2021   Hardening of the aorta (main artery of the heart) ([place]) 02/24/2021   Gastric mass 12/13/2020   Asthma 08/16/2020   Benign essential hypertension 08/16/2020   Chronic kidney disease, stage 2 (mild) 08/16/2020   Degeneration of lumbar intervertebral disc  08/16/2020   Diabetic renal disease ([place]) 08/16/2020   Drug-induced myopathy 08/16/2020   Hyperglycemia due to type 2 diabetes mellitus ([place]) 08/16/2020   Hypertensive retinopathy 08/16/2020   Lumbar radiculopathy 08/16/2020   Lumbosacral spondylosis without myelopathy 08/16/2020   Mixed hyperlipidemia 08/16/2020   Morbid obesity ([place]) 08/16/2020   Other specified disorders of bone density and structure, other site 08/16/2020   Tobacco user 08/16/2020   Type 2 diabetes mellitus with other specified complication ([place]) [id number]   Tenosynovitis of wrist 05/07/2019   OA (osteoarthritis) of hip 10/09/2017   Mass of neck 07/25/2017   Nasal vestibulitis 07/25/2017   Obstructive sleep apnea of adult 07/25/2017   Palatal papilloma 07/25/2017   Low back pain 07/18/2017   Spondylosis 07/18/2017    Past Surgical History:  Procedure Laterality Date   APPENDECTOMY  1983   CARPAL TUNNEL RELEASE Bilateral right 01/ 2002;  left 01-26-1999 dr Percell Miller   COLONOSCOPY  last one 2018   D & C HYSTEROSCOPY W/ RESECTION POLYPS  04-25-2009   dr stringer  @ [facility]   KNEE ARTHROSCOPY Right 06-13-2006  dr Percell Miller @[facility]    KNEE ARTHROSCOPY W/ LATERAL RELEASE Right    Left total hip arthroplasty     Dr. Wynelle Link  01-22-18    MULTIPLE TOOTH EXTRACTIONS     THUMB A-1 PULLEY RELEASE Left 08-14-2007   dr Percell Miller   TOTAL HIP ARTHROPLASTY Right 10/09/2017   Procedure: RIGHT TOTAL HIP ARTHROPLASTY ANTERIOR APPROACH;  Surgeon: Gaynelle Arabian, MD;  Location: [facility];  Service: Orthopedics;  Laterality: Right;   TOTAL HIP ARTHROPLASTY Left 01/22/2018   Procedure: TOTAL HIP ARTHROPLASTY ANTERIOR APPROACH;  Surgeon: Gaynelle Arabian, MD;  Location: [facility];  Service: Orthopedics;  Laterality: Left;  [id number]   UMBILICAL HERNIA REPAIR  1981    OB History   No obstetric history on file.      Home Medications    Prior to Admission medications   Medication Sig Start Date End Date Taking? Authorizing Provider  albuterol (PROVENTIL  HFA;VENTOLIN HFA) 108 (90 Base) MCG/ACT inhaler Inhale 1-2 puffs into the lungs every 6 (six) hours as needed for wheezing or shortness of breath.     [provider]  Ascorbic Acid (VITAMIN C PO) Take 1 tablet by mouth daily.    [provider]  Choline Fenofibrate 135 MG capsule Take 135 mg by mouth every morning.     [provider]  Diethylpropion HCl CR 75 MG TB24 Take 1 tablet by mouth daily. 07/15/20   [provider]  FARXIGA 5 MG TABS tablet Take 5 mg by mouth every morning. 05/04/21   [provider]  HYDROcodone-acetaminophen (NORCO/VICODIN) 5-325 MG tablet Take 1 tablet by mouth every 6 (six) hours as needed. 01/11/21   [provider]  losartan-hydrochlorothiazide (HYZAAR) 100-25 MG tablet Take 1 tablet by mouth every morning.     [provider]  Multiple Vitamin (MULTIVITAMIN WITH MINERALS) TABS tablet Take 1 tablet by mouth daily.    [provider]  OZEMPIC, 0.25 OR 0.5 MG/DOSE, 2 MG/1.5ML SOPN Inject 0.5 mg into the skin once a week. 07/22/20   [provider]  RESTASIS 0.05 % ophthalmic emulsion Place 1 drop into both eyes 2 (two) times daily.  12/01/12   [provider]  tamsulosin (FLOMAX) 0.4 MG CAPS capsule Take 0.4 mg by mouth daily. 01/11/21   [provider]  topiramate (TOPAMAX) 200 MG tablet Take 200 mg by mouth daily as needed. 08/08/20   [provider]  vitamin B-12 (CYANOCOBALAMIN) 500 MCG tablet Take 500 mcg by mouth daily.    [provider]  Vitamin D, Ergocalciferol, (DRISDOL) 1.25 MG (50000 UNIT) CAPS capsule Take 50,000 Units by mouth once a week. 05/23/20   [provider]  Grant Ruts INHUB 100-50 MCG/ACT AEPB Inhale into the lungs. 04/27/20   [provider]    Family History Family History  Problem Relation Age of Onset   Diabetes Mother    Kidney disease Mother    Hypertension Mother    Diabetes Father    Hypertension Father      Social History Social History   Tobacco Use   Smoking status: Some Days    Packs/day: 0.20    Years: 30.00    Additional pack years: 0.00    Total pack years: 6.00    Types: Cigarettes   Smokeless tobacco: Never   Tobacco comments:    "Smokes 1/2 cig every fews days or so"  Vaping Use   Vaping Use: Never used  Substance Use Topics   Alcohol use: No   Drug use: Never     Allergies   Atorvastatin, Ezetimibe, Lisinopril, Losartan potassium-hctz, Rosuvastatin, and Statins   Review of Systems Review of  Systems As per HPI  Physical Exam Triage Vital Signs ED Triage Vitals  Enc Vitals Group     BP 03/18/22 1328 132/81     Pulse Rate 03/18/22 1328 79     Resp 03/18/22 1328 18     Temp 03/18/22 1328 97.8 F (36.6 C)     Temp Source 03/18/22 1328 Oral     SpO2 03/18/22 1328 94 %     Weight --      Height --      Head Circumference --      Peak Flow --      Pain Score 03/18/22 1331 9     Pain Loc --      Pain Edu? --      Excl. in [place]? --    No data found.  Updated Vital Signs BP 132/81 (BP Location: Left Arm)   Pulse 79   Temp 97.8 F (36.6 C) (Oral)   Resp 18   SpO2 94%   Visual Acuity Right Eye Distance:   Left Eye Distance:   Bilateral Distance:    Right Eye Near:   Left Eye Near:    Bilateral Near:     Physical Exam Vitals and nursing note reviewed.  Constitutional:      General: She is not in acute distress.    Appearance: She is not ill-appearing.  Cardiovascular:     Rate and Rhythm: Normal rate and regular rhythm.     Pulses: Normal pulses.     Heart sounds: Normal heart sounds.  Pulmonary:     Effort: Pulmonary effort is normal.     Breath sounds: Normal breath sounds.  Musculoskeletal:        General: Swelling and tenderness present. Normal range of motion.     Comments: Mild swelling of the right upper arm.  Mild swelling of the right knee.  Full range of motion of the right shoulder as well as the right knee.  No significant  bruising noted.  Neurological:     Mental Status: She is alert.      UC Treatments / Results  Labs (all labs ordered are listed, but only abnormal results are displayed) Labs Reviewed - No data to display  EKG   Radiology No results found.  Procedures Procedures (including critical care time)  Medications Ordered in UC Medications - No data to display  Initial Impression / Assessment and Plan / UC Course  I have reviewed the triage vital signs and the nursing notes.  Pertinent labs & imaging results that were available during my care of the patient were reviewed by me and considered in my medical decision making (see chart for details).     1.  Right knee sprain: 2.  Right shoulder sprain: Patient is advised to follow-up with the orthopedic surgeon as scheduled Meloxicam daily Continue range of motion exercises as the pain improves Right knee brace Return to urgent care if symptoms worsen Please follow-up with orthopedic surgery for MRI as scheduled. Final Clinical Impressions(s) / UC Diagnoses   Final diagnoses:  Sprain of collateral ligament of right knee, initial encounter  Sprain of right shoulder, unspecified shoulder sprain type, initial encounter     Discharge Instructions      Apply knee brace. Rest and elevate the affected painful area.  Please continue meloxicam as directed.  Apply cold compresses intermittently as needed.  As pain recedes, begin normal activities slowly as tolerated.  Please follow-up with orthopedic surgery as scheduled.  No imaging indicated at this time.    ED Prescriptions   None    PDMP not reviewed this encounter.   Chase Picket, MD 03/18/22 [phone]

## 2022-03-18 NOTE — Discharge Instructions (Addendum)
Apply knee brace. Rest and elevate the affected painful area.  Please continue meloxicam as directed.  Apply cold compresses intermittently as needed.  As pain recedes, begin normal activities slowly as tolerated.  Please follow-up with orthopedic surgery as scheduled.  No imaging indicated at this time.

## 2022-03-21 ENCOUNTER — Ambulatory Visit: Payer: Medicare Other | Admitting: Podiatry

## 2022-03-21 DIAGNOSIS — Q828 Other specified congenital malformations of skin: Secondary | ICD-10-CM

## 2022-03-21 DIAGNOSIS — E1169 Type 2 diabetes mellitus with other specified complication: Secondary | ICD-10-CM

## 2022-03-21 DIAGNOSIS — B351 Tinea unguium: Secondary | ICD-10-CM | POA: Diagnosis not present

## 2022-03-21 DIAGNOSIS — M2011 Hallux valgus (acquired), right foot: Secondary | ICD-10-CM | POA: Diagnosis not present

## 2022-03-21 DIAGNOSIS — E119 Type 2 diabetes mellitus without complications: Secondary | ICD-10-CM

## 2022-03-21 DIAGNOSIS — L84 Corns and callosities: Secondary | ICD-10-CM

## 2022-03-21 DIAGNOSIS — M79676 Pain in unspecified toe(s): Secondary | ICD-10-CM

## 2022-03-21 DIAGNOSIS — M2012 Hallux valgus (acquired), left foot: Secondary | ICD-10-CM

## 2022-03-21 NOTE — Progress Notes (Unsigned)
ANNUAL DIABETIC FOOT EXAM  Subjective: Tamara Stokes presents today {jgcomplaint:23593}.  Chief Complaint  Patient presents with   Nail Problem    DFC BS-111 A1C-7.3 PCP-Sun PCP VST-03/09/2022    Patient confirms h/o diabetes.  Patient relates {Numbers; 0-100:15068} year h/o diabetes.  Patient denies any h/o foot wounds.  Patient has h/o foot ulcer of {jgPodToeLocator:23637}, which healed via help of ***.  Patient has h/o amputation(s): {jgamp:23617}.  Patient endorses symptoms of foot numbness.   Patient endorses symptoms of foot tingling.  Patient endorses symptoms of burning in feet.  Patient endorses symptoms of pins/needles sensation in feet.  Patient denies any numbness, tingling, burning, or pins/needle sensation in feet.  Patient has been diagnosed with neuropathy.  Risk factors: {jgriskfactors:24044}.  Donald Prose, MD is patient's PCP.  Past Medical History:  Diagnosis Date   Asthma    prn inhaler   Bilateral dry eyes    Bilateral hip pain    DDD (degenerative disc disease), lumbar    History of PID 2007   tubo-ovarian abscess   Hypertension    followed by pcp   Leg weakness, bilateral    due to OA bilateral hips   Mixed hyperlipidemia    OA (osteoarthritis)    knees   OA (osteoarthritis) of hip    RIGHT   OSA on CPAP    per study in epic 11/ 2005 very severe osa (per pt uses every night)   Osteopenia    Type 2 diabetes mellitus ([place])    followed by pcp   Wears glasses    Patient Active Problem List   Diagnosis Date Noted   Abnormal findings on diagnostic imaging of other specified body structures 02/24/2021   Chronic superficial gastritis without bleeding 02/24/2021   Hardening of the aorta (main artery of the heart) ([place]) 02/24/2021   Gastric mass 12/13/2020   Asthma 08/16/2020   Benign essential hypertension 08/16/2020   Chronic kidney disease, stage 2 (mild) 08/16/2020   Degeneration of lumbar intervertebral disc  08/16/2020   Diabetic renal disease ([place]) 08/16/2020   Drug-induced myopathy 08/16/2020   Hyperglycemia due to type 2 diabetes mellitus ([place]) 08/16/2020   Hypertensive retinopathy 08/16/2020   Lumbar radiculopathy 08/16/2020   Lumbosacral spondylosis without myelopathy 08/16/2020   Mixed hyperlipidemia 08/16/2020   Morbid obesity ([place]) 08/16/2020   Other specified disorders of bone density and structure, other site 08/16/2020   Tobacco user 08/16/2020   Type 2 diabetes mellitus with other specified complication ([place]) [id number]   Tenosynovitis of wrist 05/07/2019   OA (osteoarthritis) of hip 10/09/2017   Mass of neck 07/25/2017   Nasal vestibulitis 07/25/2017   Obstructive sleep apnea of adult 07/25/2017   Palatal papilloma 07/25/2017   Low back pain 07/18/2017   Spondylosis 07/18/2017   Past Surgical History:  Procedure Laterality Date   APPENDECTOMY  1983   CARPAL TUNNEL RELEASE Bilateral right 01/ 2002;  left 01-26-1999 dr Percell Miller   COLONOSCOPY  last one 2018   D & C HYSTEROSCOPY W/ RESECTION POLYPS  04-25-2009   dr stringer  @ [facility]   KNEE ARTHROSCOPY Right 06-13-2006  dr Percell Miller @[facility]    KNEE ARTHROSCOPY W/ LATERAL RELEASE Right    Left total hip arthroplasty     Dr. Wynelle Link 01-22-18    MULTIPLE TOOTH EXTRACTIONS     THUMB A-1 PULLEY RELEASE Left 08-14-2007   dr Percell Miller   TOTAL HIP ARTHROPLASTY Right 10/09/2017   Procedure: RIGHT TOTAL HIP ARTHROPLASTY ANTERIOR APPROACH;  Surgeon: Wynelle Link,  Pilar Plate, MD;  Location: [facility];  Service: Orthopedics;  Laterality: Right;   TOTAL HIP ARTHROPLASTY Left 01/22/2018   Procedure: TOTAL HIP ARTHROPLASTY ANTERIOR APPROACH;  Surgeon: Gaynelle Arabian, MD;  Location: [facility];  Service: Orthopedics;  Laterality: Left;  [id number]   UMBILICAL HERNIA REPAIR  1981   Current Outpatient Medications on File Prior to Visit  Medication Sig Dispense Refill   albuterol (PROVENTIL HFA;VENTOLIN HFA) 108 (90 Base) MCG/ACT inhaler Inhale 1-2 puffs into the lungs every 6  (six) hours as needed for wheezing or shortness of breath.      Ascorbic Acid (VITAMIN C PO) Take 1 tablet by mouth daily.     Choline Fenofibrate 135 MG capsule Take 135 mg by mouth every morning.      Diethylpropion HCl CR 75 MG TB24 Take 1 tablet by mouth daily.     FARXIGA 10 MG TABS tablet Take 10 mg by mouth daily.     HYDROcodone-acetaminophen (NORCO/VICODIN) 5-325 MG tablet Take 1 tablet by mouth every 6 (six) hours as needed.     losartan-hydrochlorothiazide (HYZAAR) 100-25 MG tablet Take 1 tablet by mouth every morning.      Multiple Vitamin (MULTIVITAMIN WITH MINERALS) TABS tablet Take 1 tablet by mouth daily.     OZEMPIC, 0.25 OR 0.5 MG/DOSE, 2 MG/1.5ML SOPN Inject 0.5 mg into the skin once a week.     RESTASIS 0.05 % ophthalmic emulsion Place 1 drop into both eyes 2 (two) times daily.      tamsulosin (FLOMAX) 0.4 MG CAPS capsule Take 0.4 mg by mouth daily.     topiramate (TOPAMAX) 200 MG tablet Take 200 mg by mouth daily as needed.     vitamin B-12 (CYANOCOBALAMIN) 500 MCG tablet Take 500 mcg by mouth daily.     Vitamin D, Ergocalciferol, (DRISDOL) 1.25 MG (50000 UNIT) CAPS capsule Take 50,000 Units by mouth once a week.     WIXELA INHUB 100-50 MCG/ACT AEPB Inhale into the lungs.     No current facility-administered medications on file prior to visit.    Allergies  Allergen Reactions   Atorvastatin Other (See Comments)    myalgia Other reaction(s): myalgia   Ezetimibe Other (See Comments)    Leg pains Other reaction(s): leg pains   Lisinopril Other (See Comments)    Worsens asthma Other reaction(s): worsening asthma   Losartan Potassium-Hctz     Other reaction(s): HA, blurred vision Other reaction(s): HA, blurred vision   Rosuvastatin Other (See Comments)    myalgia Other reaction(s): myalgia Other reaction(s): myalgia   Statins Swelling   Social History   Occupational History   Not on file  Tobacco Use   Smoking status: Some Days    Packs/day: 0.20    Years:  30.00    Additional pack years: 0.00    Total pack years: 6.00    Types: Cigarettes   Smokeless tobacco: Never   Tobacco comments:    "Smokes 1/2 cig every fews days or so"  Vaping Use   Vaping Use: Never used  Substance and Sexual Activity   Alcohol use: No   Drug use: Never   Sexual activity: Not Currently    Birth control/protection: Post-menopausal   Family History  Problem Relation Age of Onset   Diabetes Mother    Kidney disease Mother    Hypertension Mother    Diabetes Father    Hypertension Father    Immunization History  Administered Date(s) Administered   Hepatitis B, ADULT 11/27/2011, 01/11/2012, 06/10/2012  Influenza,inj,quad, With Preservative 11/16/2013   PFIZER(Purple Top)SARS-COV-2 Vaccination 10/12/2019, 11/06/2019, 06/12/2020   Pneumococcal Conjugate-13 07/28/2019   Pneumococcal Polysaccharide-23 11/19/2011, 05/21/2016   Pneumococcal-Unspecified 01/02/2016   Td 05/22/2017   Tdap 12/20/2006   Zoster, Live 07/28/2019, 01/19/2020     Review of Systems: Negative except as noted in the HPI.   Objective: There were no vitals filed for this visit.  Shwana Braggs Divita is a pleasant 67 y.o. female in NAD. AAO X 3.  Vascular Examination: {jgvascular:23595}  Dermatological Examination: {jgderm:23598}  Neurological Examination: {jgneuro:23601::"Protective sensation intact 5/5 intact bilaterally with 10g monofilament b/l.","Vibratory sensation intact b/l.","Proprioception intact bilaterally."}  Musculoskeletal Examination: {jgmsk:23600}  Footwear Assessment: Does the patient wear appropriate shoes? {Yes,No}. Does the patient need inserts/orthotics? {Yes,No}.  Lab Results  Component Value Date   HGBA1C 5.9 (H) 10/02/2017   No results found. ADA Risk Categorization: Low Risk :  Patient has all of the following: Intact protective sensation No prior foot ulcer  No severe deformity Pedal pulses present  High Risk  Patient has one or  more of the following: Loss of protective sensation Absent pedal pulses Severe Foot deformity History of foot ulcer  Assessment: 1. Pain due to onychomycosis of toenail   2. Porokeratosis   3. Hallux valgus, acquired, bilateral   4. Type 2 diabetes mellitus with other specified complication, without long-term current use of insulin ([place])   5. Encounter for diabetic foot exam ([place])      Plan: No orders of the defined types were placed in this encounter.   No orders of the defined types were placed in this encounter.   None  {jgplan:23602::"-Patient/POA to call should there be question/concern in the interim."} No follow-ups on file.  Marzetta Board, DPM

## 2022-03-22 ENCOUNTER — Encounter: Payer: Self-pay | Admitting: Podiatry

## 2022-03-22 DIAGNOSIS — M25511 Pain in right shoulder: Secondary | ICD-10-CM | POA: Diagnosis not present

## 2022-03-23 DIAGNOSIS — H524 Presbyopia: Secondary | ICD-10-CM | POA: Diagnosis not present

## 2022-03-23 DIAGNOSIS — H2513 Age-related nuclear cataract, bilateral: Secondary | ICD-10-CM | POA: Diagnosis not present

## 2022-03-23 DIAGNOSIS — H5203 Hypermetropia, bilateral: Secondary | ICD-10-CM | POA: Diagnosis not present

## 2022-04-05 DIAGNOSIS — G4733 Obstructive sleep apnea (adult) (pediatric): Secondary | ICD-10-CM | POA: Diagnosis not present

## 2022-04-17 ENCOUNTER — Ambulatory Visit: Payer: Medicare Other | Admitting: Podiatry

## 2022-05-14 DIAGNOSIS — M19012 Primary osteoarthritis, left shoulder: Secondary | ICD-10-CM | POA: Diagnosis not present

## 2022-05-14 DIAGNOSIS — M25811 Other specified joint disorders, right shoulder: Secondary | ICD-10-CM | POA: Diagnosis not present

## 2022-06-01 ENCOUNTER — Other Ambulatory Visit: Payer: Self-pay | Admitting: Family Medicine

## 2022-06-01 DIAGNOSIS — Z1231 Encounter for screening mammogram for malignant neoplasm of breast: Secondary | ICD-10-CM

## 2022-07-02 ENCOUNTER — Ambulatory Visit
Admission: RE | Admit: 2022-07-02 | Discharge: 2022-07-02 | Disposition: A | Discharge status: Home / Self Care | Payer: Medicare Other | Source: Ambulatory Visit | Attending: Family Medicine | Admitting: Family Medicine

## 2022-07-02 DIAGNOSIS — Z1231 Encounter for screening mammogram for malignant neoplasm of breast: Secondary | ICD-10-CM | POA: Diagnosis not present

## 2022-08-01 ENCOUNTER — Ambulatory Visit (INDEPENDENT_AMBULATORY_CARE_PROVIDER_SITE_OTHER): Payer: Medicare Other | Admitting: Podiatry

## 2022-08-01 DIAGNOSIS — Q828 Other specified congenital malformations of skin: Secondary | ICD-10-CM | POA: Diagnosis not present

## 2022-08-01 DIAGNOSIS — M79676 Pain in unspecified toe(s): Secondary | ICD-10-CM

## 2022-08-01 DIAGNOSIS — B351 Tinea unguium: Secondary | ICD-10-CM

## 2022-08-01 DIAGNOSIS — E1169 Type 2 diabetes mellitus with other specified complication: Secondary | ICD-10-CM

## 2022-08-01 NOTE — Progress Notes (Signed)
  Subjective:  Patient ID: Tamara Stokes, female    DOB: 1955-06-21,  MRN: [id number]  Tamara Stokes presents to clinic today for preventative diabetic foot care and painful porokeratotic lesion(s) right lower extremity and painful mycotic toenails that limit ambulation. Painful toenails interfere with ambulation. Aggravating factors include wearing enclosed shoe gear. Pain is relieved with periodic professional debridement. Painful porokeratotic lesions are aggravated when weightbearing with and without shoegear. Pain is relieved with periodic professional debridement.  Chief Complaint  Patient presents with   Nail Problem    dfc   New problem(s): None.   PCP is Deatra James, MD.  Allergies  Allergen Reactions   Atorvastatin Other (See Comments)    myalgia Other reaction(s): myalgia   Ezetimibe Other (See Comments)    Leg pains Other reaction(s): leg pains   Lisinopril Other (See Comments)    Worsens asthma Other reaction(s): worsening asthma   Losartan Potassium-Hctz     Other reaction(s): HA, blurred vision Other reaction(s): HA, blurred vision   Rosuvastatin Other (See Comments)    myalgia Other reaction(s): myalgia Other reaction(s): myalgia   Statins Swelling    Review of Systems: Negative except as noted in the HPI.  Objective: No changes noted in today's physical examination. There were no vitals filed for this visit. Tamara Stokes is a pleasant 67 y.o. female in NAD. AAO x 3.  Vascular Examination: Capillary refill time immediate b/l. Vascular status intact b/l with palpable pedal pulses. Pedal hair present b/l. No pain with calf compression b/l. Skin temperature gradient WNL b/l. No cyanosis or clubbing b/l. No ischemia or gangrene noted b/l.   Neurological Examination: Sensation grossly intact b/l with 10 gram monofilament. Vibratory sensation intact b/l.   Dermatological Examination: Pedal skin with normal turgor, texture  and tone b/l.  No open wounds. No interdigital macerations.   Toenails left hallux and 2-5 b/l thick, discolored, elongated with subungual debris and pain on dorsal palpation.   Anonychia noted right great toe. Nailbed(s) epithelialized.  Porokeratotic lesion(s) submet head 4 right foot. No erythema, no edema, no drainage, no fluctuance.  Musculoskeletal Examination: Muscle strength 5/5 to all lower extremity muscle groups bilaterally. Hammertoe deformity noted 2-5 b/l.  Radiographs: None  Assessment/Plan: 1. Pain due to onychomycosis of toenail   2. Porokeratosis   3. Type 2 diabetes mellitus with other specified complication, without long-term current use of insulin (HCC)      -Patient was evaluated and treated. All patient's and/or POA's questions/concerns answered on today's visit. -Patient to continue soft, supportive shoe gear daily. -Toenails were debrided in length and girth 2-5 bilaterally and left great toe with sterile nail nippers and dremel without iatrogenic bleeding.  -Porokeratotic lesion(s) submet head 4 right foot pared and enucleated with sterile currette without incident. Total number of lesions debrided=1. -Patient/POA to call should there be question/concern in the interim.   Return in about 3 months (around 11/01/2022).  Freddie Breech, DPM

## 2022-08-06 ENCOUNTER — Encounter: Payer: Self-pay | Admitting: Podiatry

## 2022-08-06 DIAGNOSIS — G4733 Obstructive sleep apnea (adult) (pediatric): Secondary | ICD-10-CM | POA: Diagnosis not present

## 2022-09-28 DIAGNOSIS — I700 Atherosclerosis of aorta: Secondary | ICD-10-CM | POA: Diagnosis not present

## 2022-09-28 DIAGNOSIS — J45909 Unspecified asthma, uncomplicated: Secondary | ICD-10-CM | POA: Diagnosis not present

## 2022-09-28 DIAGNOSIS — N182 Chronic kidney disease, stage 2 (mild): Secondary | ICD-10-CM | POA: Diagnosis not present

## 2022-09-28 DIAGNOSIS — E1122 Type 2 diabetes mellitus with diabetic chronic kidney disease: Secondary | ICD-10-CM | POA: Diagnosis not present

## 2022-09-28 DIAGNOSIS — M8588 Other specified disorders of bone density and structure, other site: Secondary | ICD-10-CM | POA: Diagnosis not present

## 2022-09-28 DIAGNOSIS — E1165 Type 2 diabetes mellitus with hyperglycemia: Secondary | ICD-10-CM | POA: Diagnosis not present

## 2022-09-28 DIAGNOSIS — Z0000 Encounter for general adult medical examination without abnormal findings: Secondary | ICD-10-CM | POA: Diagnosis not present

## 2022-09-28 DIAGNOSIS — G720 Drug-induced myopathy: Secondary | ICD-10-CM | POA: Diagnosis not present

## 2022-09-28 DIAGNOSIS — E782 Mixed hyperlipidemia: Secondary | ICD-10-CM | POA: Diagnosis not present

## 2022-09-28 DIAGNOSIS — I10 Essential (primary) hypertension: Secondary | ICD-10-CM | POA: Diagnosis not present

## 2022-09-28 DIAGNOSIS — G4733 Obstructive sleep apnea (adult) (pediatric): Secondary | ICD-10-CM | POA: Diagnosis not present

## 2022-11-13 ENCOUNTER — Emergency Department (HOSPITAL_BASED_OUTPATIENT_CLINIC_OR_DEPARTMENT_OTHER)
Admission: EM | Admit: 2022-11-13 | Discharge: 2022-11-13 | Disposition: A | Discharge status: Home / Self Care | Payer: Medicare Other | Attending: Emergency Medicine | Admitting: Emergency Medicine

## 2022-11-13 ENCOUNTER — Encounter (HOSPITAL_BASED_OUTPATIENT_CLINIC_OR_DEPARTMENT_OTHER): Payer: Self-pay

## 2022-11-13 ENCOUNTER — Other Ambulatory Visit: Payer: Self-pay

## 2022-11-13 ENCOUNTER — Emergency Department (HOSPITAL_BASED_OUTPATIENT_CLINIC_OR_DEPARTMENT_OTHER): Payer: Medicare Other

## 2022-11-13 DIAGNOSIS — I10 Essential (primary) hypertension: Secondary | ICD-10-CM | POA: Diagnosis not present

## 2022-11-13 DIAGNOSIS — M25512 Pain in left shoulder: Secondary | ICD-10-CM | POA: Insufficient documentation

## 2022-11-13 DIAGNOSIS — E119 Type 2 diabetes mellitus without complications: Secondary | ICD-10-CM | POA: Insufficient documentation

## 2022-11-13 DIAGNOSIS — R519 Headache, unspecified: Secondary | ICD-10-CM | POA: Diagnosis not present

## 2022-11-13 DIAGNOSIS — J45909 Unspecified asthma, uncomplicated: Secondary | ICD-10-CM | POA: Diagnosis not present

## 2022-11-13 DIAGNOSIS — M19012 Primary osteoarthritis, left shoulder: Secondary | ICD-10-CM | POA: Diagnosis not present

## 2022-11-13 DIAGNOSIS — Y92410 Unspecified street and highway as the place of occurrence of the external cause: Secondary | ICD-10-CM | POA: Insufficient documentation

## 2022-11-13 DIAGNOSIS — Z79899 Other long term (current) drug therapy: Secondary | ICD-10-CM | POA: Diagnosis not present

## 2022-11-13 MED ORDER — METHOCARBAMOL 500 MG PO TABS: 500.0000 mg | ORAL_TABLET | Freq: Two times a day (BID) | ORAL | 0 refills | Status: AC

## 2022-11-13 MED ORDER — ACETAMINOPHEN 325 MG PO TABS
650.0000 mg | ORAL_TABLET | Freq: Once | ORAL | Status: AC
  Administered 2022-11-13: 650 mg via ORAL
  Filled 2022-11-13: qty 2

## 2022-11-13 MED ORDER — ACETAMINOPHEN ER 650 MG PO TBCR: 650.0000 mg | EXTENDED_RELEASE_TABLET | Freq: Three times a day (TID) | ORAL | 0 refills | Status: AC | PRN

## 2022-11-13 MED ORDER — LIDOCAINE 5 % EX PTCH
1.0000 | MEDICATED_PATCH | CUTANEOUS | Status: DC
  Administered 2022-11-13: 1 via TRANSDERMAL
  Filled 2022-11-13: qty 1
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
## 2022-11-13 NOTE — ED Notes (Signed)
Reviewed discharge instructions and meds with pt. Pt states understanding. Ambulatory at discharge.
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
## 2022-11-26 DIAGNOSIS — R6883 Chills (without fever): Secondary | ICD-10-CM | POA: Diagnosis not present

## 2022-11-26 DIAGNOSIS — R051 Acute cough: Secondary | ICD-10-CM | POA: Diagnosis not present

## 2022-11-26 DIAGNOSIS — Z03818 Encounter for observation for suspected exposure to other biological agents ruled out: Secondary | ICD-10-CM | POA: Diagnosis not present

## 2022-12-04 ENCOUNTER — Ambulatory Visit: Payer: Medicare Other | Admitting: Podiatry

## 2022-12-04 ENCOUNTER — Encounter: Payer: Self-pay | Admitting: Podiatry

## 2022-12-04 DIAGNOSIS — B351 Tinea unguium: Secondary | ICD-10-CM

## 2022-12-04 DIAGNOSIS — E1169 Type 2 diabetes mellitus with other specified complication: Secondary | ICD-10-CM

## 2022-12-04 DIAGNOSIS — M79676 Pain in unspecified toe(s): Secondary | ICD-10-CM

## 2022-12-04 DIAGNOSIS — Q828 Other specified congenital malformations of skin: Secondary | ICD-10-CM | POA: Diagnosis not present

## 2022-12-09 NOTE — Progress Notes (Signed)
Subjective:  Patient ID: Tamara Stokes, female    DOB: 1955/06/14,  MRN: [id number]  67 y.o. female presents to clinic with  preventative diabetic foot care and painful porokeratotic lesion(s) right foot and painful mycotic toenails that limit ambulation. Painful toenails interfere with ambulation. Aggravating factors include wearing enclosed shoe gear. Pain is relieved with periodic professional debridement. Painful porokeratotic lesions are aggravated when weightbearing with and without shoegear. Pain is relieved with periodic professional debridement.  Chief Complaint  Patient presents with   Foot Pain   Diabetes    A1C 7.5, PCP DR. Wynelle Link, Last ov 08/01/22.      New problem(s): None   PCP is Deatra James, MD.  Allergies  Allergen Reactions   Atorvastatin Other (See Comments)    myalgia Other reaction(s): myalgia   Ezetimibe Other (See Comments)    Leg pains Other reaction(s): leg pains   Lisinopril Other (See Comments)    Worsens asthma Other reaction(s): worsening asthma   Losartan Potassium-Hctz     Other reaction(s): HA, blurred vision Other reaction(s): HA, blurred vision   Rosuvastatin Other (See Comments)    myalgia Other reaction(s): myalgia Other reaction(s): myalgia   Statins Swelling    Review of Systems: Negative except as noted in the HPI.   Objective:  Tamara Stokes is a pleasant 67 y.o. female in NAD. AAO x 3.  Vascular Examination: Vascular status intact b/l with palpable pedal pulses. CFT immediate b/l. No edema. No pain with calf compression b/l. Skin temperature gradient WNL b/l.   Neurological Examination: Sensation grossly intact b/l with 10 gram monofilament. Vibratory sensation intact b/l.   Dermatological Examination: Pedal skin with normal turgor, texture and tone b/l.  No open wounds. No interdigital macerations.   Toenails left hallux and 2-5 b/l thick, discolored, elongated with subungual debris and pain on dorsal  palpation.   Anonychia noted right great toe. Nailbed(s) epithelialized.  Porokeratotic lesion(s) submet head 4 right foot. No erythema, no edema, no drainage, no fluctuance.  Musculoskeletal Examination: Muscle strength 5/5 to all lower extremity muscle groups bilaterally. Hammertoe deformity noted 2-5 b/l.  Radiographs: None  Last A1c:       No data to display          Assessment:   1. Pain due to onychomycosis of toenail   2. Porokeratosis   3. Type 2 diabetes mellitus with other specified complication, without long-term current use of insulin (HCC)    Plan:  -Consent given for treatment as described below: -Examined patient. -Continue foot and shoe inspections daily. Monitor blood glucose per PCP/Endocrinologist's recommendations. -Continue supportive shoe gear daily. -Mycotic toenails 2-5 bilaterally and left great toe were debrided in length and girth with sterile nail nippers and dremel without iatrogenic bleeding. -Porokeratotic lesion(s) submet head 4 right foot pared and enucleated with sterile currette without incident. Total number of lesions debrided=1. -Patient/POA to call should there be question/concern in the interim.  Return in about 3 months (around 03/04/2023).  Freddie Breech, DPM      [place] LOCATION: [address] [place] [address]                   Office [phone]  [place] Rough LOCATION: [address] [place], [place] [address] Office [phone]

## 2022-12-25 DIAGNOSIS — R051 Acute cough: Secondary | ICD-10-CM | POA: Diagnosis not present

## 2022-12-25 DIAGNOSIS — J0111 Acute recurrent frontal sinusitis: Secondary | ICD-10-CM | POA: Diagnosis not present

## 2023-03-25 DIAGNOSIS — H5203 Hypermetropia, bilateral: Secondary | ICD-10-CM | POA: Diagnosis not present

## 2023-03-25 DIAGNOSIS — H2513 Age-related nuclear cataract, bilateral: Secondary | ICD-10-CM | POA: Diagnosis not present

## 2023-03-25 DIAGNOSIS — E119 Type 2 diabetes mellitus without complications: Secondary | ICD-10-CM | POA: Diagnosis not present

## 2023-03-28 DIAGNOSIS — M1711 Unilateral primary osteoarthritis, right knee: Secondary | ICD-10-CM | POA: Diagnosis not present

## 2023-03-28 DIAGNOSIS — S8001XA Contusion of right knee, initial encounter: Secondary | ICD-10-CM | POA: Diagnosis not present

## 2023-03-29 DIAGNOSIS — E1122 Type 2 diabetes mellitus with diabetic chronic kidney disease: Secondary | ICD-10-CM | POA: Diagnosis not present

## 2023-03-29 DIAGNOSIS — N181 Chronic kidney disease, stage 1: Secondary | ICD-10-CM | POA: Diagnosis not present

## 2023-03-29 DIAGNOSIS — E782 Mixed hyperlipidemia: Secondary | ICD-10-CM | POA: Diagnosis not present

## 2023-03-29 DIAGNOSIS — G720 Drug-induced myopathy: Secondary | ICD-10-CM | POA: Diagnosis not present

## 2023-03-29 DIAGNOSIS — L821 Other seborrheic keratosis: Secondary | ICD-10-CM | POA: Diagnosis not present

## 2023-03-29 DIAGNOSIS — I10 Essential (primary) hypertension: Secondary | ICD-10-CM | POA: Diagnosis not present

## 2023-04-03 ENCOUNTER — Encounter: Payer: Self-pay | Admitting: Podiatry

## 2023-04-03 ENCOUNTER — Ambulatory Visit: Admitting: Podiatry

## 2023-04-03 ENCOUNTER — Ambulatory Visit (INDEPENDENT_AMBULATORY_CARE_PROVIDER_SITE_OTHER): Payer: Medicare Other | Admitting: Podiatry

## 2023-04-03 VITALS — Ht 62.0 in | Wt 210.0 lb

## 2023-04-03 DIAGNOSIS — M2011 Hallux valgus (acquired), right foot: Secondary | ICD-10-CM

## 2023-04-03 DIAGNOSIS — M79676 Pain in unspecified toe(s): Secondary | ICD-10-CM | POA: Diagnosis not present

## 2023-04-03 DIAGNOSIS — E119 Type 2 diabetes mellitus without complications: Secondary | ICD-10-CM

## 2023-04-03 DIAGNOSIS — Z91198 Patient's noncompliance with other medical treatment and regimen for other reason: Secondary | ICD-10-CM

## 2023-04-03 DIAGNOSIS — Q828 Other specified congenital malformations of skin: Secondary | ICD-10-CM

## 2023-04-03 DIAGNOSIS — B351 Tinea unguium: Secondary | ICD-10-CM

## 2023-04-03 DIAGNOSIS — E1169 Type 2 diabetes mellitus with other specified complication: Secondary | ICD-10-CM | POA: Diagnosis not present

## 2023-04-03 DIAGNOSIS — M2012 Hallux valgus (acquired), left foot: Secondary | ICD-10-CM

## 2023-04-03 NOTE — Progress Notes (Signed)
 1. Failure to attend appointment with reason given    Patient seen earlier today.

## 2023-04-07 NOTE — Progress Notes (Signed)
 ANNUAL DIABETIC FOOT EXAM  Subjective: Tamara Stokes presents today for annual diabetic foot exam. Chief Complaint  Patient presents with   RFC    " I am here for a nail trim, her pcp is dr sun and seen a week ago"   Patient confirms h/o diabetes.  Patient denies any h/o foot wounds.  Tamara James, MD is patient's PCP.  Past Medical History:  Diagnosis Date   Asthma    prn inhaler   Bilateral dry eyes    Bilateral hip pain    DDD (degenerative disc disease), lumbar    History of PID 2007   tubo-ovarian abscess   Hypertension    followed by pcp   Leg weakness, bilateral    due to OA bilateral hips   Mixed hyperlipidemia    OA (osteoarthritis)    knees   OA (osteoarthritis) of hip    RIGHT   OSA on CPAP    per study in epic 11/ 2005 very severe osa (per pt uses every night)   Osteopenia    Type 2 diabetes mellitus (HCC)    followed by pcp   Wears glasses    Patient Active Problem List   Diagnosis Date Noted   Pain in joint of right shoulder 03/13/2022   Pain in right arm 03/13/2022   Knee pain, bilateral 03/02/2021   Abnormal findings on diagnostic imaging of other specified body structures 02/24/2021   Chronic superficial gastritis without bleeding 02/24/2021   Hardening of the aorta (main artery of the heart) (HCC) 02/24/2021   Gastric mass 12/13/2020   Asthma 08/16/2020   Benign essential hypertension 08/16/2020   Chronic kidney disease, stage 2 (mild) 08/16/2020   Degeneration of lumbar intervertebral disc 08/16/2020   Diabetic renal disease (HCC) 08/16/2020   Drug-induced myopathy 08/16/2020   Hyperglycemia due to type 2 diabetes mellitus (HCC) 08/16/2020   Hypertensive retinopathy 08/16/2020   Lumbar radiculopathy 08/16/2020   Lumbosacral spondylosis without myelopathy 08/16/2020   Mixed hyperlipidemia 08/16/2020   Morbid obesity (HCC) 08/16/2020   Other specified disorders of bone density and structure, other site 08/16/2020   Tobacco  user 08/16/2020   Type 2 diabetes mellitus with other specified complication (HCC) 08/16/2020   Tenosynovitis of wrist 05/07/2019   Pain of left hand 05/07/2019   OA (osteoarthritis) of hip 10/09/2017   Mass of neck 07/25/2017   Nasal vestibulitis 07/25/2017   Obstructive sleep apnea of adult 07/25/2017   Palatal papilloma 07/25/2017   Low back pain 07/18/2017   Spondylosis 07/18/2017   Past Surgical History:  Procedure Laterality Date   APPENDECTOMY  1983   CARPAL TUNNEL RELEASE Bilateral right 01/ 2002;  left 01-26-1999 dr Eulah Pont   COLONOSCOPY  last one 2018   D & C HYSTEROSCOPY W/ RESECTION POLYPS  04-25-2009   dr stringer  @ [facility]   KNEE ARTHROSCOPY Right 06-13-2006  dr Eulah Pont @[facility]    KNEE ARTHROSCOPY W/ LATERAL RELEASE Right    Left total hip arthroplasty     Dr. Lequita Halt 01-22-18    MULTIPLE TOOTH EXTRACTIONS     THUMB A-1 PULLEY RELEASE Left 08-14-2007   dr Eulah Pont   TOTAL HIP ARTHROPLASTY Right 10/09/2017   Procedure: RIGHT TOTAL HIP ARTHROPLASTY ANTERIOR APPROACH;  Surgeon: Ollen Gross, MD;  Location: [facility];  Service: Orthopedics;  Laterality: Right;   TOTAL HIP ARTHROPLASTY Left 01/22/2018   Procedure: TOTAL HIP ARTHROPLASTY ANTERIOR APPROACH;  Surgeon: Ollen Gross, MD;  Location: [facility];  Service: Orthopedics;  Laterality: Left;    UMBILICAL HERNIA REPAIR  1981   Current Outpatient Medications on File Prior to Visit  Medication Sig Dispense Refill   acetaminophen (TYLENOL 8 HOUR) 650 MG CR tablet Take 1 tablet (650 mg total) by mouth every 8 (eight) hours as needed for pain. 20 tablet 0   albuterol (PROVENTIL HFA;VENTOLIN HFA) 108 (90 Base) MCG/ACT inhaler Inhale 1-2 puffs into the lungs every 6 (six) hours as needed for wheezing or shortness of breath.      Ascorbic Acid (VITAMIN C PO) Take 1 tablet by mouth daily.     Choline Fenofibrate 135 MG capsule Take 135 mg by mouth every morning.      Diethylpropion HCl CR 75 MG TB24 Take 1 tablet by mouth daily.      FARXIGA 10 MG TABS tablet Take 10 mg by mouth daily.     HYDROcodone-acetaminophen (NORCO/VICODIN) 5-325 MG tablet Take 1 tablet by mouth every 6 (six) hours as needed.     losartan-hydrochlorothiazide (HYZAAR) 100-25 MG tablet Take 1 tablet by mouth every morning.      methocarbamol (ROBAXIN) 500 MG tablet Take 1 tablet (500 mg total) by mouth 2 (two) times daily. 20 tablet 0   Multiple Vitamin (MULTIVITAMIN WITH MINERALS) TABS tablet Take 1 tablet by mouth daily.     OZEMPIC, 0.25 OR 0.5 MG/DOSE, 2 MG/1.5ML SOPN Inject 0.5 mg into the skin once a week.     RESTASIS 0.05 % ophthalmic emulsion Place 1 drop into both eyes 2 (two) times daily.      tamsulosin (FLOMAX) 0.4 MG CAPS capsule Take 0.4 mg by mouth daily.     topiramate (TOPAMAX) 200 MG tablet Take 200 mg by mouth daily as needed.     vitamin B-12 (CYANOCOBALAMIN) 500 MCG tablet Take 500 mcg by mouth daily.     Vitamin D, Ergocalciferol, (DRISDOL) 1.25 MG (50000 UNIT) CAPS capsule Take 50,000 Units by mouth once a week.     WIXELA INHUB 100-50 MCG/ACT AEPB Inhale into the lungs.     No current facility-administered medications on file prior to visit.    Allergies  Allergen Reactions   Atorvastatin Other (See Comments)    myalgia Other reaction(s): myalgia   Ezetimibe Other (See Comments)    Leg pains Other reaction(s): leg pains   Lisinopril Other (See Comments)    Worsens asthma Other reaction(s): worsening asthma   Losartan Potassium-Hctz     Other reaction(s): HA, blurred vision Other reaction(s): HA, blurred vision   Statins Swelling   Rosuvastatin Other (See Comments)    myalgia Other reaction(s): myalgia Other reaction(s): myalgia   Social History   Occupational History   Not on file  Tobacco Use   Smoking status: Some Days    Current packs/day: 0.20    Average packs/day: 0.2 packs/day for 30.0 years (6.0 ttl pk-yrs)    Types: Cigarettes   Smokeless tobacco: Never   Tobacco comments:    "Smokes 1/2 cig  every fews days or so"  Vaping Use   Vaping status: Never Used  Substance and Sexual Activity   Alcohol use: No   Drug use: Never   Sexual activity: Not Currently    Birth control/protection: Post-menopausal   Family History  Problem Relation Age of Onset   Diabetes Mother    Kidney disease Mother    Hypertension Mother    Diabetes Father    Hypertension Father    Immunization History  Administered Date(s) Administered  Hepatitis B, ADULT 11/27/2011, 01/11/2012, 06/10/2012   Influenza,inj,quad, With Preservative 11/16/2013   PFIZER(Purple Top)SARS-COV-2 Vaccination 10/12/2019, 11/06/2019, 06/12/2020   Pneumococcal Conjugate-13 07/28/2019   Pneumococcal Polysaccharide-23 11/19/2011, 05/21/2016   Pneumococcal-Unspecified 01/02/2016   Td 05/22/2017   Tdap 12/20/2006   Zoster Recombinant(Shingrix) 01/19/2020   Zoster, Live 07/28/2019, 01/19/2020     Review of Systems: Negative except as noted in the HPI.   Objective: There were no vitals filed for this visit.  Tamara Stokes is a pleasant 68 y.o. female in NAD. AAO X 3.  Diabetic foot exam was performed with the following findings:   Vascular Examination: Capillary refill time immediate b/l. Vascular status intact b/l with palpable pedal pulses. Pedal hair present b/l. No pain with calf compression b/l. Skin temperature gradient WNL b/l. No cyanosis or clubbing b/l. No ischemia or gangrene noted b/l.   Neurological Examination: Sensation grossly intact b/l with 10 gram monofilament. Vibratory sensation intact b/l.   Dermatological Examination: Pedal skin with normal turgor, texture and tone b/l.  No open wounds. No interdigital macerations.   Toenails left great toe and 2-5 b/l thick, discolored, elongated with subungual debris and pain on dorsal palpation. Anonychia right great toe.  Porokeratotic lesion(s) submet head 4 right foot. No erythema, no edema, no drainage, no fluctuance.  Musculoskeletal  Examination: Muscle strength 5/5 to all lower extremity muscle groups bilaterally. Hammertoe deformity noted 2-5 b/l.  Radiographs: None     Lab Results  Component Value Date   HGBA1C 5.9 (H) 10/02/2017   ADA Risk Categorization: Low Risk :  Patient has all of the following: Intact protective sensation No prior foot ulcer  No severe deformity Pedal pulses present  Assessment: 1. Pain due to onychomycosis of toenail   2. Porokeratosis   3. Hallux valgus, acquired, bilateral   4. Type 2 diabetes mellitus with other specified complication, without long-term current use of insulin (HCC)   5. Encounter for diabetic foot exam (HCC)     Plan: Diabetic foot examination performed today.  All patient's and/or POA's questions/concerns addressed on today's visit. Toenails 1-5 debrided in length and girth without incident. Porokeratotic lesion(s) submet head 5 right foot pared with sharp debridement without incident. Continue daily foot inspections and monitor blood glucose per PCP/Endocrinologist's recommendations. Continue soft, supportive shoe gear daily. Report any pedal injuries to medical professional. Call office if there are any questions/concerns. -Patient/POA to call should there be question/concern in the interim. Return in about 3 months (around 07/03/2023).  Freddie Breech, DPM      [place] LOCATION: [address] [place] [address]                   Office [phone]   [facility] LOCATION: [address] [place], [place] [address] Office [phone]

## 2023-06-03 ENCOUNTER — Other Ambulatory Visit: Payer: Self-pay | Admitting: Family Medicine

## 2023-06-03 DIAGNOSIS — Z1231 Encounter for screening mammogram for malignant neoplasm of breast: Secondary | ICD-10-CM

## 2023-06-10 ENCOUNTER — Other Ambulatory Visit (HOSPITAL_COMMUNITY): Payer: Self-pay

## 2023-06-10 MED ORDER — LOSARTAN POTASSIUM-HCTZ 100-25 MG PO TABS
1.0000 | ORAL_TABLET | Freq: Every day | ORAL | 1 refills | Status: AC
Start: 2023-06-10 — End: ?
  Filled 2023-06-10 – 2023-06-11 (×2): qty 90, 90d supply, fill #0
  Filled 2023-09-10 (×2): qty 90, 90d supply, fill #1

## 2023-06-10 MED ORDER — FENOFIBRIC ACID 135 MG PO CPDR
1.0000 | DELAYED_RELEASE_CAPSULE | Freq: Every day | ORAL | 1 refills | Status: AC
  Filled 2023-06-10 – 2023-06-12 (×4): qty 100, 100d supply, fill #0
  Filled 2023-09-10: qty 90, 90d supply, fill #1

## 2023-06-10 MED ORDER — DAPAGLIFLOZIN PROPANEDIOL 10 MG PO TABS
10.0000 mg | ORAL_TABLET | Freq: Every morning | ORAL | 1 refills | Status: AC
  Filled 2023-06-10 – 2023-06-12 (×4): qty 90, 90d supply, fill #0

## 2023-06-11 ENCOUNTER — Other Ambulatory Visit (HOSPITAL_COMMUNITY): Payer: Self-pay

## 2023-06-12 ENCOUNTER — Other Ambulatory Visit (HOSPITAL_COMMUNITY): Payer: Self-pay

## 2023-06-13 ENCOUNTER — Other Ambulatory Visit (HOSPITAL_COMMUNITY): Payer: Self-pay

## 2023-06-13 ENCOUNTER — Other Ambulatory Visit: Payer: Self-pay

## 2023-06-26 DIAGNOSIS — G4733 Obstructive sleep apnea (adult) (pediatric): Secondary | ICD-10-CM | POA: Diagnosis not present

## 2023-07-03 ENCOUNTER — Ambulatory Visit
Admission: RE | Admit: 2023-07-03 | Discharge: 2023-07-03 | Disposition: A | Discharge status: Home / Self Care | Source: Ambulatory Visit | Attending: Family Medicine | Admitting: Family Medicine

## 2023-07-03 DIAGNOSIS — Z1231 Encounter for screening mammogram for malignant neoplasm of breast: Secondary | ICD-10-CM

## 2023-07-17 ENCOUNTER — Ambulatory Visit (INDEPENDENT_AMBULATORY_CARE_PROVIDER_SITE_OTHER): Admitting: Podiatry

## 2023-07-17 ENCOUNTER — Encounter: Payer: Self-pay | Admitting: Podiatry

## 2023-07-17 DIAGNOSIS — B351 Tinea unguium: Secondary | ICD-10-CM | POA: Diagnosis not present

## 2023-07-17 DIAGNOSIS — M79676 Pain in unspecified toe(s): Secondary | ICD-10-CM

## 2023-07-17 DIAGNOSIS — Q828 Other specified congenital malformations of skin: Secondary | ICD-10-CM | POA: Diagnosis not present

## 2023-07-17 DIAGNOSIS — E1169 Type 2 diabetes mellitus with other specified complication: Secondary | ICD-10-CM

## 2023-07-22 NOTE — Progress Notes (Signed)
  Subjective:  Patient ID: Tamara Stokes, female    DOB: [birth year],  MRN: [id number]  68 y.o. female presents to clinic with  preventative diabetic foot care and painful porokeratotic lesion(s) right foot and painful mycotic toenails that limit ambulation. Painful toenails interfere with ambulation. Aggravating factors include wearing enclosed shoe gear. Pain is relieved with periodic professional debridement. Painful porokeratotic lesions are aggravated when weightbearing with and without shoegear. Pain is relieved with periodic professional debridement.  Chief Complaint  Patient presents with   RFC    RM16 Rfc /diabetic/Dr Austin     New problem(s): None   PCP is Sun, Vyvyan, MD.  Allergies  Allergen Reactions   Atorvastatin Other (See Comments)    myalgia Other reaction(s): myalgia   Ezetimibe Other (See Comments)    Leg pains Other reaction(s): leg pains   Lisinopril Other (See Comments)    Worsens asthma Other reaction(s): worsening asthma   Losartan  Potassium-Hctz     Other reaction(s): HA, blurred vision Other reaction(s): HA, blurred vision   Statins Swelling   Rosuvastatin Other (See Comments)    myalgia Other reaction(s): myalgia Other reaction(s): myalgia    Review of Systems: Negative except as noted in the HPI.   Objective:  Tamara Stokes is a pleasant 68 y.o. female in NAD. AAO x 3.  Vascular Examination: Vascular status intact b/l with palpable pedal pulses. CFT immediate b/l. No edema. No pain with calf compression b/l. Skin temperature gradient WNL b/l. No edema noted b/l LE.  Neurological Examination: Sensation grossly intact b/l with 10 gram monofilament.  Dermatological Examination: Pedal skin with normal turgor, texture and tone b/l. Toenails left hallux and 2-5 b/l thick, discolored, elongated with subungual debris and pain on dorsal palpation. No hyperkeratotic lesions noted b/l. Anonychia noted R hallux. Nailbed(s)  epithelialized.  Porokeratotic lesion(s) submet head 4 right foot. No erythema, no edema, no drainage, no fluctuance.  Musculoskeletal Examination: Muscle strength 5/5 to b/l LE. Hammertoe(s) 2-5 bilaterally.  Radiographs: None  Last A1c:       No data to display           Assessment:   1. Pain due to onychomycosis of toenail   2. Porokeratosis   3. Type 2 diabetes mellitus with other specified complication, without long-term current use of insulin  [facility])    Plan:  Consent given for treatment. Patient examined. All patient's and/or POA's questions/concerns addressed on today's visit. Toenails left great toe and 2-5 b/l debrided in length and girth without incident. Porokeratotic lesion(s) submet head 5 right foot pared and enucleated with sharp debridement without incident. Continue foot and shoe inspections daily. Monitor blood glucose per PCP/Endocrinologist's recommendations.Continue soft, supportive shoe gear daily. Report any pedal injuries to medical professional. Call office if there are any questions/concerns.  Return in about 3 months (around 10/17/2023).  Delon LITTIE Merlin, DPM      [place] LOCATION: [address] [place] [address]                   Office [phone]   [facility] LOCATION: [address] [place], [place] [address] Office [phone]

## 2023-09-10 ENCOUNTER — Other Ambulatory Visit (HOSPITAL_COMMUNITY): Payer: Self-pay

## 2023-10-02 DIAGNOSIS — G4733 Obstructive sleep apnea (adult) (pediatric): Secondary | ICD-10-CM | POA: Diagnosis not present

## 2023-10-30 ENCOUNTER — Ambulatory Visit: Admitting: Podiatry

## 2023-10-30 ENCOUNTER — Encounter: Payer: Self-pay | Admitting: Podiatry

## 2023-10-30 DIAGNOSIS — M79676 Pain in unspecified toe(s): Secondary | ICD-10-CM | POA: Diagnosis not present

## 2023-10-30 DIAGNOSIS — B351 Tinea unguium: Secondary | ICD-10-CM | POA: Diagnosis not present

## 2023-10-30 DIAGNOSIS — Q828 Other specified congenital malformations of skin: Secondary | ICD-10-CM

## 2023-10-30 DIAGNOSIS — E1169 Type 2 diabetes mellitus with other specified complication: Secondary | ICD-10-CM

## 2023-11-03 NOTE — Progress Notes (Signed)
  Subjective:  Patient ID: Tamara Stokes, female    DOB: [birth year],  MRN: [id number]  Tamara Stokes presents to clinic today for preventative diabetic foot care and painful porokeratotic lesion(s) right foot and painful mycotic toenails that limit ambulation. Painful toenails interfere with ambulation. Aggravating factors include wearing enclosed shoe gear. Pain is relieved with periodic professional debridement. Painful porokeratotic lesions are aggravated when weightbearing with and without shoegear. Pain is relieved with periodic professional debridement.  Patient states she attempted to pick at the plantar lesion, but was unable to get the core out. Chief Complaint  Patient presents with   Diabetes    DFC NIDDM A1C 7. Toenail trim. LOV with PCP 03/29/23.   New problem(s): None.   PCP is Sun, Vyvyan, MD.  Allergies  Allergen Reactions   Atorvastatin Other (See Comments)    myalgia Other reaction(s): myalgia   Ezetimibe Other (See Comments)    Leg pains Other reaction(s): leg pains   Lisinopril Other (See Comments)    Worsens asthma Other reaction(s): worsening asthma   Losartan  Potassium-Hctz     Other reaction(s): HA, blurred vision Other reaction(s): HA, blurred vision   Statins Swelling   Rosuvastatin Other (See Comments)    myalgia Other reaction(s): myalgia Other reaction(s): myalgia    Review of Systems: Negative except as noted in the HPI.  Objective: No changes noted in today's physical examination. There were no vitals filed for this visit. Tamara Stokes is a pleasant 68 y.o. female obese in NAD. AAO x 3.  Neurovascular status intact bilaterally and symmetrically.  Dermatological Examination: Pedal skin with normal turgor, texture and tone b/l. Toenails left hallux and 2-5 b/l thick, discolored, elongated with subungual debris and pain on dorsal palpation. No hyperkeratotic lesions noted b/l. Anonychia noted R hallux.  Nailbed(s) epithelialized.  Porokeratotic lesion(s) submet head 4 right foot. No erythema, no edema, no drainage, no fluctuance.  Musculoskeletal Examination: Muscle strength 5/5 to b/l LE. Hammertoe(s) 2-5 bilaterally.  Radiographs: None  Assessment/Plan: 1. Pain due to onychomycosis of toenail   2. Porokeratosis   3. Type 2 diabetes mellitus with other specified complication, without long-term current use of insulin  [facility])     -Patient was evaluated today. All questions/concerns addressed on today's visit. -Examined patient. -Continue foot and shoe inspections daily. Monitor blood glucose per PCP/Endocrinologist's recommendations. -Patient to continue soft, supportive shoe gear daily. -Mycotic toenails 2-5 bilaterally and left great toe were debrided in length and girth with sterile nail nippers and dremel without iatrogenic bleeding. -Porokeratotic lesion(s) submet head 4 right foot pared and enucleated with sterile currette without incident. Total number of lesions debrided=1. -Patient/POA to call should there be question/concern in the interim.   Return in about 3 months (around 01/30/2024).  Delon LITTIE Merlin, DPM       LOCATION: [address] [place] [address]                   Office [phone]   [facility] LOCATION: [address] [place], [place] [address] Office [phone]

## 2024-02-11 ENCOUNTER — Ambulatory Visit: Admitting: Podiatry
